# Patient Record
Sex: Female | Born: 1945 | Race: White | Hispanic: No | Marital: Married | Smoking: Never smoker
Health system: Southern US, Community
[De-identification: ages and names within clinical notes are randomized; demographics above are authoritative.]

## PROBLEM LIST (undated history)

## (undated) DIAGNOSIS — E039 Hypothyroidism, unspecified: Secondary | ICD-10-CM

## (undated) DIAGNOSIS — K219 Gastro-esophageal reflux disease without esophagitis: Secondary | ICD-10-CM

## (undated) DIAGNOSIS — M1990 Unspecified osteoarthritis, unspecified site: Secondary | ICD-10-CM

## (undated) DIAGNOSIS — R1013 Epigastric pain: Secondary | ICD-10-CM

## (undated) DIAGNOSIS — E663 Overweight: Secondary | ICD-10-CM

## (undated) DIAGNOSIS — I10 Essential (primary) hypertension: Secondary | ICD-10-CM

## (undated) DIAGNOSIS — T4145XA Adverse effect of unspecified anesthetic, initial encounter: Secondary | ICD-10-CM

## (undated) DIAGNOSIS — F419 Anxiety disorder, unspecified: Secondary | ICD-10-CM

## (undated) DIAGNOSIS — L719 Rosacea, unspecified: Secondary | ICD-10-CM

## (undated) DIAGNOSIS — R011 Cardiac murmur, unspecified: Secondary | ICD-10-CM

## (undated) DIAGNOSIS — Z9889 Other specified postprocedural states: Secondary | ICD-10-CM

## (undated) DIAGNOSIS — G709 Myoneural disorder, unspecified: Secondary | ICD-10-CM

## (undated) DIAGNOSIS — T8859XA Other complications of anesthesia, initial encounter: Secondary | ICD-10-CM

## (undated) DIAGNOSIS — E569 Vitamin deficiency, unspecified: Secondary | ICD-10-CM

## (undated) DIAGNOSIS — H9313 Tinnitus, bilateral: Secondary | ICD-10-CM

## (undated) DIAGNOSIS — R112 Nausea with vomiting, unspecified: Secondary | ICD-10-CM

## (undated) DIAGNOSIS — E7800 Pure hypercholesterolemia, unspecified: Secondary | ICD-10-CM

## (undated) DIAGNOSIS — M810 Age-related osteoporosis without current pathological fracture: Secondary | ICD-10-CM

## (undated) HISTORY — PX: OVARIAN CYST SURGERY: SHX726

## (undated) HISTORY — PX: NECK SURGERY: SHX720

## (undated) HISTORY — DX: Age-related osteoporosis without current pathological fracture: M81.0

## (undated) HISTORY — DX: Rosacea, unspecified: L71.9

## (undated) HISTORY — DX: Overweight: E66.3

## (undated) HISTORY — DX: Vitamin deficiency, unspecified: E56.9

## (undated) HISTORY — DX: Anxiety disorder, unspecified: F41.9

## (undated) HISTORY — PX: CHOLECYSTECTOMY: SHX55

## (undated) HISTORY — PX: APPENDECTOMY: SHX54

## (undated) HISTORY — DX: Hypothyroidism, unspecified: E03.9

## (undated) HISTORY — PX: TUBAL LIGATION: SHX77

## (undated) HISTORY — DX: Epigastric pain: R10.13

---

## 2001-12-24 ENCOUNTER — Encounter: Admission: RE | Admit: 2001-12-24 | Discharge: 2002-03-24 | Payer: Self-pay

## 2007-01-16 ENCOUNTER — Ambulatory Visit: Payer: Self-pay | Admitting: Cardiology

## 2007-01-16 LAB — CONVERTED CEMR LAB
Basophils Relative: 0.5 % (ref 0.0–1.0)
Monocytes Relative: 3.1 % (ref 3.0–11.0)
Neutro Abs: 7.2 10*3/uL (ref 1.4–7.7)
Platelets: 226 10*3/uL (ref 150–400)
RBC: 4.22 M/uL (ref 3.87–5.11)
RDW: 12.3 % (ref 11.5–14.6)

## 2007-01-23 ENCOUNTER — Ambulatory Visit: Payer: Self-pay

## 2007-02-06 ENCOUNTER — Ambulatory Visit: Payer: Self-pay | Admitting: Cardiology

## 2013-10-16 DIAGNOSIS — R9431 Abnormal electrocardiogram [ECG] [EKG]: Secondary | ICD-10-CM | POA: Diagnosis not present

## 2013-10-16 DIAGNOSIS — Z136 Encounter for screening for cardiovascular disorders: Secondary | ICD-10-CM | POA: Diagnosis not present

## 2013-10-16 DIAGNOSIS — Z13820 Encounter for screening for osteoporosis: Secondary | ICD-10-CM | POA: Diagnosis not present

## 2013-10-16 DIAGNOSIS — Z0000 Encounter for general adult medical examination without abnormal findings: Secondary | ICD-10-CM | POA: Diagnosis not present

## 2013-10-16 DIAGNOSIS — Z23 Encounter for immunization: Secondary | ICD-10-CM | POA: Diagnosis not present

## 2013-10-16 DIAGNOSIS — Z1239 Encounter for other screening for malignant neoplasm of breast: Secondary | ICD-10-CM | POA: Diagnosis not present

## 2013-10-16 DIAGNOSIS — I10 Essential (primary) hypertension: Secondary | ICD-10-CM | POA: Diagnosis not present

## 2013-10-16 DIAGNOSIS — E782 Mixed hyperlipidemia: Secondary | ICD-10-CM | POA: Diagnosis not present

## 2013-10-16 DIAGNOSIS — Z124 Encounter for screening for malignant neoplasm of cervix: Secondary | ICD-10-CM | POA: Diagnosis not present

## 2013-10-16 DIAGNOSIS — E7800 Pure hypercholesterolemia, unspecified: Secondary | ICD-10-CM | POA: Diagnosis not present

## 2013-10-22 DIAGNOSIS — Z809 Family history of malignant neoplasm, unspecified: Secondary | ICD-10-CM | POA: Diagnosis not present

## 2013-10-22 DIAGNOSIS — I059 Rheumatic mitral valve disease, unspecified: Secondary | ICD-10-CM | POA: Diagnosis not present

## 2013-10-22 DIAGNOSIS — I10 Essential (primary) hypertension: Secondary | ICD-10-CM | POA: Diagnosis not present

## 2013-10-22 DIAGNOSIS — M109 Gout, unspecified: Secondary | ICD-10-CM | POA: Diagnosis not present

## 2013-10-22 DIAGNOSIS — E785 Hyperlipidemia, unspecified: Secondary | ICD-10-CM | POA: Diagnosis not present

## 2013-10-22 DIAGNOSIS — Z8249 Family history of ischemic heart disease and other diseases of the circulatory system: Secondary | ICD-10-CM | POA: Diagnosis not present

## 2013-10-22 DIAGNOSIS — Z823 Family history of stroke: Secondary | ICD-10-CM | POA: Diagnosis not present

## 2013-10-22 DIAGNOSIS — Z789 Other specified health status: Secondary | ICD-10-CM | POA: Diagnosis not present

## 2013-10-28 DIAGNOSIS — Z13820 Encounter for screening for osteoporosis: Secondary | ICD-10-CM | POA: Diagnosis not present

## 2013-10-28 DIAGNOSIS — M810 Age-related osteoporosis without current pathological fracture: Secondary | ICD-10-CM | POA: Diagnosis not present

## 2013-10-28 DIAGNOSIS — M47812 Spondylosis without myelopathy or radiculopathy, cervical region: Secondary | ICD-10-CM | POA: Diagnosis not present

## 2013-10-28 DIAGNOSIS — Z1231 Encounter for screening mammogram for malignant neoplasm of breast: Secondary | ICD-10-CM | POA: Diagnosis not present

## 2013-10-28 DIAGNOSIS — R209 Unspecified disturbances of skin sensation: Secondary | ICD-10-CM | POA: Diagnosis not present

## 2013-10-28 DIAGNOSIS — M4802 Spinal stenosis, cervical region: Secondary | ICD-10-CM | POA: Diagnosis not present

## 2013-11-05 DIAGNOSIS — M5030 Other cervical disc degeneration, unspecified cervical region: Secondary | ICD-10-CM | POA: Diagnosis not present

## 2013-11-05 DIAGNOSIS — M4802 Spinal stenosis, cervical region: Secondary | ICD-10-CM | POA: Diagnosis not present

## 2013-11-05 DIAGNOSIS — M542 Cervicalgia: Secondary | ICD-10-CM | POA: Diagnosis not present

## 2013-11-06 DIAGNOSIS — I059 Rheumatic mitral valve disease, unspecified: Secondary | ICD-10-CM | POA: Diagnosis not present

## 2013-11-20 DIAGNOSIS — I10 Essential (primary) hypertension: Secondary | ICD-10-CM | POA: Diagnosis not present

## 2013-11-20 DIAGNOSIS — M542 Cervicalgia: Secondary | ICD-10-CM | POA: Diagnosis not present

## 2013-11-20 DIAGNOSIS — M47812 Spondylosis without myelopathy or radiculopathy, cervical region: Secondary | ICD-10-CM | POA: Diagnosis not present

## 2013-11-20 DIAGNOSIS — Z6829 Body mass index (BMI) 29.0-29.9, adult: Secondary | ICD-10-CM | POA: Diagnosis not present

## 2013-11-27 DIAGNOSIS — I10 Essential (primary) hypertension: Secondary | ICD-10-CM | POA: Diagnosis not present

## 2013-11-27 DIAGNOSIS — E785 Hyperlipidemia, unspecified: Secondary | ICD-10-CM | POA: Diagnosis not present

## 2013-11-27 DIAGNOSIS — I059 Rheumatic mitral valve disease, unspecified: Secondary | ICD-10-CM | POA: Diagnosis not present

## 2014-01-22 DIAGNOSIS — M545 Low back pain, unspecified: Secondary | ICD-10-CM | POA: Diagnosis not present

## 2014-01-22 DIAGNOSIS — IMO0002 Reserved for concepts with insufficient information to code with codable children: Secondary | ICD-10-CM | POA: Diagnosis not present

## 2014-01-22 DIAGNOSIS — M25519 Pain in unspecified shoulder: Secondary | ICD-10-CM | POA: Diagnosis not present

## 2014-01-22 DIAGNOSIS — W010XXA Fall on same level from slipping, tripping and stumbling without subsequent striking against object, initial encounter: Secondary | ICD-10-CM | POA: Diagnosis not present

## 2014-01-22 DIAGNOSIS — M542 Cervicalgia: Secondary | ICD-10-CM | POA: Diagnosis not present

## 2014-01-22 DIAGNOSIS — S199XXA Unspecified injury of neck, initial encounter: Secondary | ICD-10-CM | POA: Diagnosis not present

## 2014-01-22 DIAGNOSIS — S0993XA Unspecified injury of face, initial encounter: Secondary | ICD-10-CM | POA: Diagnosis not present

## 2014-01-22 DIAGNOSIS — R42 Dizziness and giddiness: Secondary | ICD-10-CM | POA: Diagnosis not present

## 2014-01-22 DIAGNOSIS — S46909A Unspecified injury of unspecified muscle, fascia and tendon at shoulder and upper arm level, unspecified arm, initial encounter: Secondary | ICD-10-CM | POA: Diagnosis not present

## 2014-01-22 DIAGNOSIS — S139XXA Sprain of joints and ligaments of unspecified parts of neck, initial encounter: Secondary | ICD-10-CM | POA: Diagnosis not present

## 2014-01-22 DIAGNOSIS — S0990XA Unspecified injury of head, initial encounter: Secondary | ICD-10-CM | POA: Diagnosis not present

## 2014-01-22 DIAGNOSIS — R51 Headache: Secondary | ICD-10-CM | POA: Diagnosis not present

## 2014-01-22 DIAGNOSIS — T148XXA Other injury of unspecified body region, initial encounter: Secondary | ICD-10-CM | POA: Diagnosis not present

## 2014-01-22 DIAGNOSIS — S4980XA Other specified injuries of shoulder and upper arm, unspecified arm, initial encounter: Secondary | ICD-10-CM | POA: Diagnosis not present

## 2014-01-29 DIAGNOSIS — M542 Cervicalgia: Secondary | ICD-10-CM | POA: Diagnosis not present

## 2014-01-29 DIAGNOSIS — F329 Major depressive disorder, single episode, unspecified: Secondary | ICD-10-CM | POA: Diagnosis not present

## 2014-01-29 DIAGNOSIS — E7800 Pure hypercholesterolemia, unspecified: Secondary | ICD-10-CM | POA: Diagnosis not present

## 2014-01-29 DIAGNOSIS — M109 Gout, unspecified: Secondary | ICD-10-CM | POA: Diagnosis not present

## 2014-01-29 DIAGNOSIS — I10 Essential (primary) hypertension: Secondary | ICD-10-CM | POA: Diagnosis not present

## 2014-01-29 DIAGNOSIS — R209 Unspecified disturbances of skin sensation: Secondary | ICD-10-CM | POA: Diagnosis not present

## 2014-01-29 DIAGNOSIS — E782 Mixed hyperlipidemia: Secondary | ICD-10-CM | POA: Diagnosis not present

## 2014-02-02 DIAGNOSIS — E782 Mixed hyperlipidemia: Secondary | ICD-10-CM | POA: Diagnosis not present

## 2014-02-02 DIAGNOSIS — I10 Essential (primary) hypertension: Secondary | ICD-10-CM | POA: Diagnosis not present

## 2014-03-04 DIAGNOSIS — M47812 Spondylosis without myelopathy or radiculopathy, cervical region: Secondary | ICD-10-CM | POA: Diagnosis not present

## 2014-03-04 DIAGNOSIS — M4802 Spinal stenosis, cervical region: Secondary | ICD-10-CM | POA: Diagnosis not present

## 2014-03-04 DIAGNOSIS — Z6826 Body mass index (BMI) 26.0-26.9, adult: Secondary | ICD-10-CM | POA: Diagnosis not present

## 2014-03-04 DIAGNOSIS — M542 Cervicalgia: Secondary | ICD-10-CM | POA: Diagnosis not present

## 2014-03-09 ENCOUNTER — Other Ambulatory Visit: Payer: Self-pay | Admitting: Neurosurgery

## 2014-04-02 ENCOUNTER — Encounter (HOSPITAL_COMMUNITY): Payer: Self-pay

## 2014-04-05 NOTE — Pre-Procedure Instructions (Signed)
Debra GentryKaren L Mullen  04/05/2014   Your procedure is scheduled on:  Wednesday, July 8th   [facility] 9:45 AM.             (Arrival time is per your surgeon's request)   Call this number if you have problems the morning of surgery: [phone]   Remember:   Do not eat food or drink liquids after midnight Tuesday, July 7th.    Take these medicines the morning of surgery with A SIP OF WATER: Metoprolol, Allopurinol   Do not wear jewelry, make-up or nail polish.  Do not wear lotions, powders, or perfumes. You may NOT wear deodorant.  Do not shave underarms & legs 48 hours prior to surgery.    Do not bring valuables to the hospital.  [facility] is not responsible for any belongings or valuables.               Contacts, dentures or bridgework may not be worn into surgery.  Leave suitcase in the car. After surgery it may be brought to your room.  For patients admitted to the hospital, discharge time is determined by your treatment team.           Name and phone number of your driver:    Special Instructions: "Preparing for Surgery" instruction sheet.   Please read over the following fact sheets that you were given: Pain Booklet, MRSA Information and Surgical Site Infection Prevention

## 2014-04-06 ENCOUNTER — Ambulatory Visit (HOSPITAL_COMMUNITY)
Admission: RE | Admit: 2014-04-06 | Discharge: 2014-04-06 | Disposition: A | Discharge status: Home / Self Care | Payer: Medicare Other | Source: Ambulatory Visit | Attending: Neurosurgery | Admitting: Neurosurgery

## 2014-04-06 ENCOUNTER — Encounter (HOSPITAL_COMMUNITY)
Admission: RE | Admit: 2014-04-06 | Discharge: 2014-04-06 | Disposition: A | Discharge status: Home / Self Care | Payer: Medicare Other | Source: Ambulatory Visit | Attending: Neurosurgery | Admitting: Neurosurgery

## 2014-04-06 ENCOUNTER — Encounter (HOSPITAL_COMMUNITY): Payer: Self-pay

## 2014-04-06 DIAGNOSIS — I10 Essential (primary) hypertension: Secondary | ICD-10-CM | POA: Insufficient documentation

## 2014-04-06 DIAGNOSIS — Z01818 Encounter for other preprocedural examination: Secondary | ICD-10-CM | POA: Diagnosis not present

## 2014-04-06 DIAGNOSIS — I517 Cardiomegaly: Secondary | ICD-10-CM | POA: Diagnosis not present

## 2014-04-06 HISTORY — DX: Unspecified osteoarthritis, unspecified site: M19.90

## 2014-04-06 HISTORY — DX: Pure hypercholesterolemia, unspecified: E78.00

## 2014-04-06 HISTORY — DX: Cardiac murmur, unspecified: R01.1

## 2014-04-06 HISTORY — DX: Other specified postprocedural states: Z98.890

## 2014-04-06 HISTORY — DX: Gastro-esophageal reflux disease without esophagitis: K21.9

## 2014-04-06 HISTORY — DX: Tinnitus, bilateral: H93.13

## 2014-04-06 HISTORY — DX: Myoneural disorder, unspecified: G70.9

## 2014-04-06 HISTORY — DX: Other complications of anesthesia, initial encounter: T88.59XA

## 2014-04-06 HISTORY — DX: Other specified postprocedural states: R11.2

## 2014-04-06 HISTORY — DX: Essential (primary) hypertension: I10

## 2014-04-06 HISTORY — DX: Adverse effect of unspecified anesthetic, initial encounter: T41.45XA

## 2014-04-06 LAB — CBC
HCT: 41.7 % (ref 36.0–46.0)
Hemoglobin: 13.7 g/dL (ref 12.0–15.0)
MCH: 30.6 pg (ref 26.0–34.0)
MCHC: 32.9 g/dL (ref 30.0–36.0)
MCV: 93.3 fL (ref 78.0–100.0)
Platelets: 200 10*3/uL (ref 150–400)
RBC: 4.47 MIL/uL (ref 3.87–5.11)
RDW: 12.5 % (ref 11.5–15.5)
WBC: 9.7 10*3/uL (ref 4.0–10.5)

## 2014-04-06 LAB — SURGICAL PCR SCREEN
MRSA, PCR: NEGATIVE
STAPHYLOCOCCUS AUREUS: NEGATIVE

## 2014-04-06 LAB — COMPREHENSIVE METABOLIC PANEL
ALT: 22 U/L (ref 0–35)
AST: 25 U/L (ref 0–37)
Albumin: 4.3 g/dL (ref 3.5–5.2)
Alkaline Phosphatase: 87 U/L (ref 39–117)
BILIRUBIN TOTAL: 0.3 mg/dL (ref 0.3–1.2)
BUN: 16 mg/dL (ref 6–23)
CHLORIDE: 101 meq/L (ref 96–112)
CO2: 27 mEq/L (ref 19–32)
Calcium: 10.4 mg/dL (ref 8.4–10.5)
Creatinine, Ser: 0.66 mg/dL (ref 0.50–1.10)
GFR calc Af Amer: >90 mL/min (ref >90)
GFR calc non Af Amer: >90 mL/min (ref >90)
Glucose, Bld: 99 mg/dL (ref 70–99)
POTASSIUM: 4.7 meq/L (ref 3.7–5.3)
Sodium: 141 mEq/L (ref 137–147)
TOTAL PROTEIN: 8 g/dL (ref 6.0–8.3)

## 2014-04-13 MED ORDER — CEFAZOLIN SODIUM-DEXTROSE 2-3 GM-% IV SOLR
2.0000 g | INTRAVENOUS | Status: AC
  Administered 2014-04-14: 2 g via INTRAVENOUS
  Filled 2014-04-13: qty 50

## 2014-04-14 ENCOUNTER — Inpatient Hospital Stay (HOSPITAL_COMMUNITY): Payer: Medicare Other

## 2014-04-14 ENCOUNTER — Encounter (HOSPITAL_COMMUNITY)
Admission: RE | Disposition: A | Discharge status: Home / Self Care | Payer: Self-pay | Source: Ambulatory Visit | Attending: Neurosurgery

## 2014-04-14 ENCOUNTER — Inpatient Hospital Stay (HOSPITAL_COMMUNITY)
Admission: RE | Admit: 2014-04-14 | Discharge: 2014-04-15 | DRG: 473 | Disposition: A | Discharge status: Home / Self Care | Payer: Medicare Other | Source: Ambulatory Visit | Attending: Neurosurgery | Admitting: Neurosurgery

## 2014-04-14 ENCOUNTER — Encounter (HOSPITAL_COMMUNITY): Payer: Self-pay | Admitting: *Deleted

## 2014-04-14 ENCOUNTER — Encounter (HOSPITAL_COMMUNITY): Payer: Medicare Other | Admitting: Vascular Surgery

## 2014-04-14 ENCOUNTER — Inpatient Hospital Stay (HOSPITAL_COMMUNITY): Payer: Medicare Other | Admitting: Certified Registered Nurse Anesthetist

## 2014-04-14 DIAGNOSIS — M4712 Other spondylosis with myelopathy, cervical region: Secondary | ICD-10-CM | POA: Diagnosis present

## 2014-04-14 DIAGNOSIS — M5030 Other cervical disc degeneration, unspecified cervical region: Secondary | ICD-10-CM | POA: Diagnosis present

## 2014-04-14 DIAGNOSIS — M79609 Pain in unspecified limb: Secondary | ICD-10-CM | POA: Diagnosis not present

## 2014-04-14 DIAGNOSIS — Z79899 Other long term (current) drug therapy: Secondary | ICD-10-CM

## 2014-04-14 DIAGNOSIS — I10 Essential (primary) hypertension: Secondary | ICD-10-CM | POA: Diagnosis not present

## 2014-04-14 DIAGNOSIS — E7800 Pure hypercholesterolemia, unspecified: Secondary | ICD-10-CM | POA: Diagnosis not present

## 2014-04-14 DIAGNOSIS — K219 Gastro-esophageal reflux disease without esophagitis: Secondary | ICD-10-CM | POA: Diagnosis present

## 2014-04-14 DIAGNOSIS — M4802 Spinal stenosis, cervical region: Secondary | ICD-10-CM | POA: Diagnosis not present

## 2014-04-14 DIAGNOSIS — M47812 Spondylosis without myelopathy or radiculopathy, cervical region: Principal | ICD-10-CM | POA: Diagnosis present

## 2014-04-14 DIAGNOSIS — M5382 Other specified dorsopathies, cervical region: Secondary | ICD-10-CM | POA: Diagnosis not present

## 2014-04-14 DIAGNOSIS — M5412 Radiculopathy, cervical region: Secondary | ICD-10-CM | POA: Diagnosis not present

## 2014-04-14 DIAGNOSIS — M4722 Other spondylosis with radiculopathy, cervical region: Secondary | ICD-10-CM

## 2014-04-14 DIAGNOSIS — Z981 Arthrodesis status: Secondary | ICD-10-CM | POA: Diagnosis not present

## 2014-04-14 HISTORY — DX: Other spondylosis with myelopathy, cervical region: M47.12

## 2014-04-14 HISTORY — PX: ANTERIOR CERVICAL DECOMP/DISCECTOMY FUSION: SHX1161

## 2014-04-14 SURGERY — ANTERIOR CERVICAL DECOMPRESSION/DISCECTOMY FUSION 3 LEVELS
Anesthesia: General

## 2014-04-14 MED ORDER — ONDANSETRON HCL 4 MG/2ML IJ SOLN
INTRAMUSCULAR | Status: AC
  Filled 2014-04-14: qty 2

## 2014-04-14 MED ORDER — BUPIVACAINE-EPINEPHRINE (PF) 0.5% -1:200000 IJ SOLN
INTRAMUSCULAR | Status: DC | PRN
  Administered 2014-04-14: 10 mL via PERINEURAL

## 2014-04-14 MED ORDER — BACITRACIN ZINC 500 UNIT/GM EX OINT
TOPICAL_OINTMENT | CUTANEOUS | Status: DC | PRN
  Administered 2014-04-14: 1 via TOPICAL

## 2014-04-14 MED ORDER — RAMIPRIL 5 MG PO CAPS
10.0000 mg | ORAL_CAPSULE | Freq: Every day | ORAL | Status: DC
  Administered 2014-04-14 – 2014-04-15 (×2): 10 mg via ORAL
  Filled 2014-04-14 (×2): qty 2

## 2014-04-14 MED ORDER — ROCURONIUM BROMIDE 50 MG/5ML IV SOLN
INTRAVENOUS | Status: AC
  Filled 2014-04-14: qty 1

## 2014-04-14 MED ORDER — DEXAMETHASONE SODIUM PHOSPHATE 4 MG/ML IJ SOLN
4.0000 mg | Freq: Four times a day (QID) | INTRAMUSCULAR | Status: AC
  Administered 2014-04-14 – 2014-04-15 (×3): 4 mg via INTRAVENOUS
  Filled 2014-04-14 (×3): qty 1

## 2014-04-14 MED ORDER — PHENYLEPHRINE 40 MCG/ML (10ML) SYRINGE FOR IV PUSH (FOR BLOOD PRESSURE SUPPORT)
PREFILLED_SYRINGE | INTRAVENOUS | Status: AC
  Filled 2014-04-14: qty 10

## 2014-04-14 MED ORDER — FENTANYL CITRATE 0.05 MG/ML IJ SOLN
INTRAMUSCULAR | Status: AC
  Filled 2014-04-14: qty 5

## 2014-04-14 MED ORDER — ARTIFICIAL TEARS OP OINT
TOPICAL_OINTMENT | OPHTHALMIC | Status: DC | PRN
  Administered 2014-04-14: 1 via OPHTHALMIC

## 2014-04-14 MED ORDER — METOCLOPRAMIDE HCL 5 MG/ML IJ SOLN
INTRAMUSCULAR | Status: AC
  Filled 2014-04-14: qty 2

## 2014-04-14 MED ORDER — MENTHOL 3 MG MT LOZG: 1.0000 | LOZENGE | OROMUCOSAL | Status: DC | PRN

## 2014-04-14 MED ORDER — ACETAMINOPHEN 650 MG RE SUPP: 650.0000 mg | RECTAL | Status: DC | PRN

## 2014-04-14 MED ORDER — FUROSEMIDE 20 MG PO TABS: 20.0000 mg | ORAL_TABLET | Freq: Every day | ORAL | Status: DC | PRN

## 2014-04-14 MED ORDER — HYDROCODONE-ACETAMINOPHEN 5-325 MG PO TABS: 1.0000 | ORAL_TABLET | ORAL | Status: DC | PRN

## 2014-04-14 MED ORDER — ALUM & MAG HYDROXIDE-SIMETH 200-200-20 MG/5ML PO SUSP: 30.0000 mL | Freq: Four times a day (QID) | ORAL | Status: DC | PRN

## 2014-04-14 MED ORDER — GLYCOPYRROLATE 0.2 MG/ML IJ SOLN
INTRAMUSCULAR | Status: AC
  Filled 2014-04-14: qty 3

## 2014-04-14 MED ORDER — METOCLOPRAMIDE HCL 5 MG/ML IJ SOLN
INTRAMUSCULAR | Status: DC | PRN
  Administered 2014-04-14: 10 mg via INTRAVENOUS

## 2014-04-14 MED ORDER — DEXAMETHASONE SODIUM PHOSPHATE 4 MG/ML IJ SOLN
INTRAMUSCULAR | Status: AC
  Filled 2014-04-14: qty 1

## 2014-04-14 MED ORDER — PROPOFOL 10 MG/ML IV BOLUS
INTRAVENOUS | Status: DC | PRN
  Administered 2014-04-14: 200 mg via INTRAVENOUS

## 2014-04-14 MED ORDER — MORPHINE SULFATE 2 MG/ML IJ SOLN
1.0000 mg | INTRAMUSCULAR | Status: DC | PRN
  Administered 2014-04-14 – 2014-04-15 (×3): 2 mg via INTRAVENOUS
  Filled 2014-04-14 (×3): qty 1

## 2014-04-14 MED ORDER — NEOSTIGMINE METHYLSULFATE 10 MG/10ML IV SOLN
INTRAVENOUS | Status: AC
Start: 2014-04-14 — End: 2014-04-14
  Filled 2014-04-14: qty 1

## 2014-04-14 MED ORDER — PHENOL 1.4 % MT LIQD: 1.0000 | OROMUCOSAL | Status: DC | PRN

## 2014-04-14 MED ORDER — ONDANSETRON HCL 4 MG/2ML IJ SOLN: 4.0000 mg | INTRAMUSCULAR | Status: DC | PRN

## 2014-04-14 MED ORDER — DOCUSATE SODIUM 100 MG PO CAPS
100.0000 mg | ORAL_CAPSULE | Freq: Two times a day (BID) | ORAL | Status: DC
  Administered 2014-04-14 – 2014-04-15 (×2): 100 mg via ORAL
  Filled 2014-04-14 (×2): qty 1

## 2014-04-14 MED ORDER — ARTIFICIAL TEARS OP OINT
TOPICAL_OINTMENT | OPHTHALMIC | Status: AC
  Filled 2014-04-14: qty 3.5

## 2014-04-14 MED ORDER — DIPHENOXYLATE-ATROPINE 2.5-0.025 MG PO TABS: 1.0000 | ORAL_TABLET | Freq: Four times a day (QID) | ORAL | Status: DC | PRN

## 2014-04-14 MED ORDER — CEFAZOLIN SODIUM-DEXTROSE 2-3 GM-% IV SOLR
2.0000 g | Freq: Three times a day (TID) | INTRAVENOUS | Status: AC
  Administered 2014-04-14 – 2014-04-15 (×2): 2 g via INTRAVENOUS
  Filled 2014-04-14 (×2): qty 50

## 2014-04-14 MED ORDER — FENTANYL CITRATE 0.05 MG/ML IJ SOLN
INTRAMUSCULAR | Status: DC | PRN
  Administered 2014-04-14 (×3): 50 ug via INTRAVENOUS
  Administered 2014-04-14: 150 ug via INTRAVENOUS
  Administered 2014-04-14: 50 ug via INTRAVENOUS

## 2014-04-14 MED ORDER — OXYCODONE-ACETAMINOPHEN 5-325 MG PO TABS
1.0000 | ORAL_TABLET | ORAL | Status: DC | PRN
  Administered 2014-04-14 – 2014-04-15 (×2): 2 via ORAL
  Administered 2014-04-15: 1 via ORAL
  Filled 2014-04-14: qty 2
  Filled 2014-04-14: qty 1

## 2014-04-14 MED ORDER — HYDROMORPHONE HCL PF 1 MG/ML IJ SOLN
INTRAMUSCULAR | Status: AC
  Filled 2014-04-14: qty 1

## 2014-04-14 MED ORDER — ONDANSETRON HCL 4 MG/2ML IJ SOLN: 4.0000 mg | Freq: Once | INTRAMUSCULAR | Status: DC | PRN

## 2014-04-14 MED ORDER — MIDAZOLAM HCL 5 MG/5ML IJ SOLN
INTRAMUSCULAR | Status: DC | PRN
  Administered 2014-04-14: 2 mg via INTRAVENOUS

## 2014-04-14 MED ORDER — OXYCODONE-ACETAMINOPHEN 5-325 MG PO TABS
ORAL_TABLET | ORAL | Status: AC
  Filled 2014-04-14: qty 2

## 2014-04-14 MED ORDER — HYDROMORPHONE HCL PF 1 MG/ML IJ SOLN
0.2500 mg | INTRAMUSCULAR | Status: DC | PRN
  Administered 2014-04-14 (×3): 0.5 mg via INTRAVENOUS

## 2014-04-14 MED ORDER — METOPROLOL SUCCINATE ER 25 MG PO TB24
50.0000 mg | ORAL_TABLET | Freq: Every day | ORAL | Status: DC
  Administered 2014-04-15: 50 mg via ORAL
  Filled 2014-04-14: qty 2

## 2014-04-14 MED ORDER — LACTATED RINGERS IV SOLN
INTRAVENOUS | Status: DC
  Administered 2014-04-14 (×2): via INTRAVENOUS

## 2014-04-14 MED ORDER — DEXAMETHASONE SODIUM PHOSPHATE 10 MG/ML IJ SOLN
INTRAMUSCULAR | Status: DC | PRN
  Administered 2014-04-14: 8 mg via INTRAVENOUS

## 2014-04-14 MED ORDER — PHENYLEPHRINE HCL 10 MG/ML IJ SOLN
INTRAMUSCULAR | Status: DC | PRN
  Administered 2014-04-14: 40 ug via INTRAVENOUS
  Administered 2014-04-14 (×2): 80 ug via INTRAVENOUS

## 2014-04-14 MED ORDER — LACTATED RINGERS IV SOLN
INTRAVENOUS | Status: DC
  Administered 2014-04-15: 02:00:00 via INTRAVENOUS

## 2014-04-14 MED ORDER — DIAZEPAM 5 MG PO TABS
5.0000 mg | ORAL_TABLET | Freq: Four times a day (QID) | ORAL | Status: DC | PRN
  Administered 2014-04-14: 5 mg via ORAL

## 2014-04-14 MED ORDER — NEOSTIGMINE METHYLSULFATE 10 MG/10ML IV SOLN
INTRAVENOUS | Status: DC | PRN
  Administered 2014-04-14: 3 mg via INTRAVENOUS

## 2014-04-14 MED ORDER — ACETAMINOPHEN 325 MG PO TABS: 650.0000 mg | ORAL_TABLET | ORAL | Status: DC | PRN

## 2014-04-14 MED ORDER — DEXAMETHASONE 4 MG PO TABS: 4.0000 mg | ORAL_TABLET | Freq: Four times a day (QID) | ORAL | Status: AC

## 2014-04-14 MED ORDER — ALLOPURINOL 100 MG PO TABS
100.0000 mg | ORAL_TABLET | Freq: Every day | ORAL | Status: DC
  Administered 2014-04-15: 100 mg via ORAL
  Filled 2014-04-14: qty 1

## 2014-04-14 MED ORDER — LACTATED RINGERS IV SOLN
INTRAVENOUS | Status: DC | PRN
  Administered 2014-04-14 (×2): via INTRAVENOUS

## 2014-04-14 MED ORDER — LIDOCAINE HCL 4 % MT SOLN
OROMUCOSAL | Status: DC | PRN
  Administered 2014-04-14: 4 mL via TOPICAL

## 2014-04-14 MED ORDER — ONDANSETRON HCL 4 MG/2ML IJ SOLN
INTRAMUSCULAR | Status: DC | PRN
  Administered 2014-04-14: 4 mg via INTRAVENOUS

## 2014-04-14 MED ORDER — GLYCOPYRROLATE 0.2 MG/ML IJ SOLN
INTRAMUSCULAR | Status: DC | PRN
  Administered 2014-04-14: 0.4 mg via INTRAVENOUS

## 2014-04-14 MED ORDER — LIDOCAINE HCL (CARDIAC) 20 MG/ML IV SOLN
INTRAVENOUS | Status: DC | PRN
  Administered 2014-04-14: 80 mg via INTRAVENOUS

## 2014-04-14 MED ORDER — THROMBIN 20000 UNITS EX SOLR
CUTANEOUS | Status: DC | PRN
  Administered 2014-04-14: 15:00:00 via TOPICAL

## 2014-04-14 MED ORDER — SODIUM CHLORIDE 0.9 % IR SOLN
Status: DC | PRN
  Administered 2014-04-14: 15:00:00

## 2014-04-14 MED ORDER — DIAZEPAM 5 MG PO TABS
ORAL_TABLET | ORAL | Status: AC
  Filled 2014-04-14: qty 2

## 2014-04-14 MED ORDER — LIDOCAINE HCL (CARDIAC) 20 MG/ML IV SOLN
INTRAVENOUS | Status: AC
  Filled 2014-04-14: qty 5

## 2014-04-14 MED ORDER — ROCURONIUM BROMIDE 100 MG/10ML IV SOLN
INTRAVENOUS | Status: DC | PRN
  Administered 2014-04-14: 10 mg via INTRAVENOUS
  Administered 2014-04-14: 30 mg via INTRAVENOUS

## 2014-04-14 MED ORDER — MIDAZOLAM HCL 2 MG/2ML IJ SOLN
INTRAMUSCULAR | Status: AC
  Filled 2014-04-14: qty 2

## 2014-04-14 MED ORDER — 0.9 % SODIUM CHLORIDE (POUR BTL) OPTIME
TOPICAL | Status: DC | PRN
  Administered 2014-04-14: 1000 mL

## 2014-04-14 MED ORDER — ALBUMIN HUMAN 5 % IV SOLN
INTRAVENOUS | Status: DC | PRN
  Administered 2014-04-14: 16:00:00 via INTRAVENOUS

## 2014-04-14 SURGICAL SUPPLY — 67 items
BAG DECANTER FOR FLEXI CONT (MISCELLANEOUS) ×3 IMPLANT
BENZOIN TINCTURE PRP APPL 2/3 (GAUZE/BANDAGES/DRESSINGS) ×3 IMPLANT
BIT DRILL NEURO 2X3.1 SFT TUCH (MISCELLANEOUS) ×1 IMPLANT
BLADE 10 SAFETY STRL DISP (BLADE) ×3 IMPLANT
BLADE SURG 15 STRL LF DISP TIS (BLADE) ×1 IMPLANT
BLADE SURG 15 STRL SS (BLADE) ×2
BLADE ULTRA TIP 2M (BLADE) ×3 IMPLANT
BRUSH SCRUB EZ PLAIN DRY (MISCELLANEOUS) ×3 IMPLANT
BUR BARREL STRAIGHT FLUTE 4.0 (BURR) ×3 IMPLANT
CANISTER SUCT 3000ML (MISCELLANEOUS) ×3 IMPLANT
CLOSURE WOUND 1/2 X4 (GAUZE/BANDAGES/DRESSINGS) ×1
CONT SPEC 4OZ CLIKSEAL STRL BL (MISCELLANEOUS) ×3 IMPLANT
COVER MAYO STAND STRL (DRAPES) ×3 IMPLANT
DEVICE FUSION VIST S 14X14X6MM (Trauma) ×3 IMPLANT
DRAIN JACKSON PRATT 10MM FLAT (MISCELLANEOUS) ×3 IMPLANT
DRAPE LAPAROTOMY 100X72 PEDS (DRAPES) ×3 IMPLANT
DRAPE MICROSCOPE LEICA (MISCELLANEOUS) IMPLANT
DRAPE POUCH INSTRU U-SHP 10X18 (DRAPES) ×3 IMPLANT
DRAPE SURG 17X23 STRL (DRAPES) ×6 IMPLANT
DRILL NEURO 2X3.1 SOFT TOUCH (MISCELLANEOUS) ×3
ELECT REM PT RETURN 9FT ADLT (ELECTROSURGICAL) ×3
ELECTRODE REM PT RTRN 9FT ADLT (ELECTROSURGICAL) ×1 IMPLANT
EVACUATOR SILICONE 100CC (DRAIN) ×3 IMPLANT
GAUZE SPONGE 4X4 16PLY XRAY LF (GAUZE/BANDAGES/DRESSINGS) IMPLANT
GLOVE BIO SURGEON STRL SZ8.5 (GLOVE) ×3 IMPLANT
GLOVE BIOGEL PI IND STRL 7.0 (GLOVE) ×3 IMPLANT
GLOVE BIOGEL PI IND STRL 7.5 (GLOVE) ×1 IMPLANT
GLOVE BIOGEL PI INDICATOR 7.0 (GLOVE) ×6
GLOVE BIOGEL PI INDICATOR 7.5 (GLOVE) ×2
GLOVE ECLIPSE 6.5 STRL STRAW (GLOVE) ×3 IMPLANT
GLOVE EXAM NITRILE LRG STRL (GLOVE) IMPLANT
GLOVE EXAM NITRILE MD LF STRL (GLOVE) IMPLANT
GLOVE EXAM NITRILE XL STR (GLOVE) IMPLANT
GLOVE EXAM NITRILE XS STR PU (GLOVE) IMPLANT
GLOVE SS BIOGEL STRL SZ 7.5 (GLOVE) ×4 IMPLANT
GLOVE SS BIOGEL STRL SZ 8 (GLOVE) ×3 IMPLANT
GLOVE SUPERSENSE BIOGEL SZ 7.5 (GLOVE) ×8
GLOVE SUPERSENSE BIOGEL SZ 8 (GLOVE) ×6
GOWN STRL REUS W/ TWL LRG LVL3 (GOWN DISPOSABLE) ×3 IMPLANT
GOWN STRL REUS W/ TWL XL LVL3 (GOWN DISPOSABLE) ×2 IMPLANT
GOWN STRL REUS W/TWL LRG LVL3 (GOWN DISPOSABLE) ×6
GOWN STRL REUS W/TWL XL LVL3 (GOWN DISPOSABLE) ×4
KIT BASIN OR (CUSTOM PROCEDURE TRAY) ×3 IMPLANT
KIT ROOM TURNOVER OR (KITS) ×3 IMPLANT
MARKER SKIN DUAL TIP RULER LAB (MISCELLANEOUS) ×3 IMPLANT
NEEDLE HYPO 22GX1.5 SAFETY (NEEDLE) ×3 IMPLANT
NEEDLE SPNL 18GX3.5 QUINCKE PK (NEEDLE) ×3 IMPLANT
NS IRRIG 1000ML POUR BTL (IV SOLUTION) ×3 IMPLANT
PACK LAMINECTOMY NEURO (CUSTOM PROCEDURE TRAY) ×3 IMPLANT
PATTIES SURGICAL .5 X.5 (GAUZE/BANDAGES/DRESSINGS) ×6 IMPLANT
PIN DISTRACTION 14MM (PIN) ×6 IMPLANT
PLATE ANT CERV XTEND 3 LV 39 (Plate) ×3 IMPLANT
PUTTY 5ML ACTIFUSE ABX (Putty) ×3 IMPLANT
RUBBERBAND STERILE (MISCELLANEOUS) IMPLANT
SCREW XTD VAR 4.2 SELF TAP 12 (Screw) ×24 IMPLANT
SPONGE GAUZE 4X4 12PLY (GAUZE/BANDAGES/DRESSINGS) ×3 IMPLANT
SPONGE INTESTINAL PEANUT (DISPOSABLE) ×6 IMPLANT
SPONGE SURGIFOAM ABS GEL 100 (HEMOSTASIS) ×3 IMPLANT
STRIP CLOSURE SKIN 1/2X4 (GAUZE/BANDAGES/DRESSINGS) ×2 IMPLANT
SUT VIC AB 0 CT1 27 (SUTURE) ×2
SUT VIC AB 0 CT1 27XBRD ANTBC (SUTURE) ×1 IMPLANT
SUT VIC AB 3-0 SH 8-18 (SUTURE) ×3 IMPLANT
SYR 20ML ECCENTRIC (SYRINGE) ×3 IMPLANT
TOWEL OR 17X24 6PK STRL BLUE (TOWEL DISPOSABLE) ×3 IMPLANT
TOWEL OR 17X26 10 PK STRL BLUE (TOWEL DISPOSABLE) ×3 IMPLANT
VISTA S O 14X14X6MM (Trauma) ×9 IMPLANT
WATER STERILE IRR 1000ML POUR (IV SOLUTION) ×3 IMPLANT

## 2014-04-14 NOTE — Anesthesia Postprocedure Evaluation (Signed)
  Anesthesia Post-op Note  Patient: Debra GentryKaren L Mullen  Procedure(s) Performed: Procedure(s): ANTERIOR CERVICAL DECOMPRESSION/DISCECTOMY FUSION 3 LEVELS cervical four/five, five/six, six/seven (N/A)  Patient Location: PACU  Anesthesia Type:General  Level of Consciousness: awake, alert , oriented and patient cooperative  Airway and Oxygen Therapy: Patient Spontanous Breathing  Post-op Pain: mild  Post-op Assessment: Post-op Vital signs reviewed, Patient's Cardiovascular Status Stable, Respiratory Function Stable, Patent Airway and No signs of Nausea or vomiting  Post-op Vital Signs: stable  Last Vitals:  Filed Vitals:   04/14/14 0937  BP: 167/54  Pulse: 63  Temp: 36.7 C  Resp: 20    Complications: No apparent anesthesia complications

## 2014-04-14 NOTE — Transfer of Care (Signed)
Immediate Anesthesia Transfer of Care Note  Patient: Debra GentryKaren L Korell  Procedure(s) Performed: Procedure(s): ANTERIOR CERVICAL DECOMPRESSION/DISCECTOMY FUSION 3 LEVELS cervical four/five, five/six, six/seven (N/A)  Patient Location: PACU  Anesthesia Type:General  Level of Consciousness: awake, alert , oriented and patient cooperative  Airway & Oxygen Therapy: Patient Spontanous Breathing and Patient connected to nasal cannula oxygen  Post-op Assessment: Report given to PACU RN, Post -op Vital signs reviewed and stable and Patient moving all extremities X 4  Post vital signs: Reviewed and stable  Complications: No apparent anesthesia complications

## 2014-04-14 NOTE — Anesthesia Procedure Notes (Signed)
Procedure Name: Intubation Date/Time: 04/14/2014 12:58 PM Performed by: Ned Grace Pre-anesthesia Checklist: Patient identified, Timeout performed, Emergency Drugs available, Suction available and Patient being monitored Patient Re-evaluated:Patient Re-evaluated prior to inductionOxygen Delivery Method: Circle system utilized Preoxygenation: Pre-oxygenation with 100% oxygen Intubation Type: IV induction Ventilation: Mask ventilation without difficulty Laryngoscope Size: Mac and 3 Grade View: Grade II Tube type: Oral Tube size: 7.0 mm Number of attempts: 1 Airway Equipment and Method: Stylet and LTA kit utilized Placement Confirmation: ETT inserted through vocal cords under direct vision,  breath sounds checked- equal and bilateral and positive ETCO2 Secured at: 22 cm Tube secured with: Tape Dental Injury: Teeth and Oropharynx as per pre-operative assessment

## 2014-04-14 NOTE — Progress Notes (Signed)
Subjective:  The patient is, but easily arousable. She is in no apparent distress. She looks well.  Objective: Vital signs in last 24 hours: Temp:  [98 F (36.7 C)] 98 F (36.7 C) (07/08 0937) Pulse Rate:  [63] 63 (07/08 0937) Resp:  [20] 20 (07/08 0937) BP: (167)/(54) 167/54 mmHg (07/08 0937) SpO2:  [100 %] 100 % (07/08 0937) Weight:  [74.645 kg (164 lb 9 oz)] 74.645 kg (164 lb 9 oz) (07/08 0937)  Intake/Output from previous day:   Intake/Output this shift: Total I/O In: 2250 [I.V.:2000; IV Piggyback:250] Out: 150 [Blood:150]  Physical exam the patient is Glasgow Coma Scale 14. She is moving all 4 extremities well. Her dressing is clean and dry. There is no evidence of hematoma or shift.  Lab Results: No results found for this basename: WBC, HGB, HCT, PLT,  in the last 72 hours BMET No results found for this basename: NA, K, CL, CO2, GLUCOSE, BUN, CREATININE, CALCIUM,  in the last 72 hours  Studies/Results: Dg Cervical Spine 2-3 Views  04/14/2014   CLINICAL DATA:  Lateral portable cervical spine images for surgical localization for cervical fusion from C4 through C7.  EXAM: CERVICAL SPINE - 2-3 VIEW  COMPARISON:  MRI, 11/05/2013  FINDINGS: Initial image shows placement of a surgical probe with its tip superimposed over the anterior aspect of the C4-C5 disc. Subsequent image shows placement of a fusion plate with fixation screws spanning C4 through C7. Radiolucent disc spacers and bone graft material are well positioned maintaining disc height at diffuse levels. Orthopedic hardware is well-seated. No fracture or evidence of an operative complication.  IMPRESSION: Lateral cervical spine imaging during anterior cervical disc fusion from C4 through C7.   Electronically Signed   By: Amie Portlandavid  Ormond M.D.   On: 04/14/2014 16:21    Assessment/Plan: The patient is doing well. I spoke with her husband.  LOS: 0 days     Vipul Cafarelli D 04/14/2014, 4:49 PM

## 2014-04-14 NOTE — Op Note (Signed)
Brief history: The patient is a 68 year old white female who has complained of neck and arm pain consistent with a cervical radiculopathy. She has failed medical management and was worked up with a cervical MRI. This demonstrated this degeneration, spondylosis, stenosis, etc. at C4-5, C5-6 and C6-7. I discussed situation with the patient. She has weighed the risks, benefits, and alternatives surgery and decided proceed with a C4-5, C5-6 and C6-7 anterior cervical discectomy, fusion, and plating.  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 Delma OfficerJeff Gino Garrabrant  Asst.: Dr. Barbaraann BarthelKyle Cabell  Anesthesia: Gen. endotracheal  Estimated blood loss: 125 cc  Drains: One 10 mm flat Jackson-Pratt drain in the prevertebral space.  Complications: None  Description of procedure: The patient was brought to the operating room by the anesthesia team. General endotracheal anesthesia was induced. A roll was placed under the patient's shoulders to keep the neck in the neutral position. The patient's anterior cervical region was then prepared with Betadine scrub and Betadine solution. Sterile drapes were applied.  The area to be incised was then injected with Marcaine with epinephrine solution. I then used a scalpel to make a transverse incision in the patient's left anterior neck. I used the Metzenbaum scissors to divide the platysmal muscle and then to dissect medial to the sternocleidomastoid muscle, jugular vein, and carotid artery. I carefully dissected down  towards the anterior cervical spine identifying the esophagus and retracting it medially. Then using Kitner swabs to clear soft tissue from the anterior cervical spine. We then inserted a bent spinal needle into the upper exposed intervertebral disc space. We then obtained intraoperative radiographs confirm our location.  I then used electrocautery to detach the medial border of the longus colli muscle bilaterally from the C4-5, C5-6 and C6-7 intervertebral disc spaces. I then inserted the Caspar self-retaining retractor underneath the longus colli muscle bilaterally to provide exposure.  We then incised the intervertebral disc at C4-5. We then performed a partial intervertebral discectomy with a pituitary forceps and the Karlin curettes. I then inserted distraction screws into the vertebral bodies at C4-5. We then distracted the interspace. We then used the high-speed drill to decorticate the vertebral endplates at C4-5, to drill away the remainder of the intervertebral disc, to drill away some posterior spondylosis, and to thin out the posterior longitudinal ligament. I then incised ligament with the arachnoid knife. We then removed the ligament with a Kerrison punches undercutting the vertebral endplates and decompressing the thecal sac. We then performed foraminotomies about the bilateral C5 nerve roots. This completed the decompression at this level.  We then repeated procedure in an analogous fashion at C5-6 and C6-7 decompressing the thecal sac and the bilateral C7 nerve roots.  We now turned our to attention to the interbody fusion. We used the trial spacers to determine the appropriate size for the interbody prosthesis. We then pre-filled prosthesis with a combination of local morcellized autograft bone that we obtained during decompression as well as Actifuse bone graft extender. We then inserted the prosthesis into the distracted interspace at C4-5, C5-6 and C6-7. We then removed the distraction  screws. There was a good snug fit of the  prosthesis in the interspace.  Having completed the fusion we now turned attention to the anterior spinal instrumentation. We used the high-speed drill to drill away some anterior spondylosis at the disc spaces so that the plate lay down flat. We selected the appropriate length titanium anterior cervical plate. We laid it along the anterior aspect of the vertebral bodies from C4-C7. We then drilled 12 mm holes at C4, C5, C6 and C7. We then secured the plate to the vertebral bodies by placing two 12 mm self-tapping screws at C4, C5, C6 and C7. We then obtained intraoperative radiograph. The demonstrating good position of the instrumentation. We therefore secured the screws the plate the locking each cam. This completed the instrumentation.  We then obtained hemostasis using bipolar electrocautery. We irrigated the wound out with bacitracin solution. We then removed the retractor. We inspected the esophagus for any damage. There was none apparent. I placed a 10 mm flat Jackson-Pratt drain in the prevertebral space. We tunneled it out through separate stab wound. We then reapproximated patient's platysmal muscle with interrupted 3-0 Vicryl suture. We then reapproximated the subcutaneous tissue with interrupted 3-0 Vicryl suture. The skin was reapproximated with Steri-Strips and benzoin. The wound was then covered with bacitracin ointment. A sterile dressing was applied. The drapes were removed. Patient was subsequently extubated by the anesthesia team and transported to the post anesthesia care unit in stable condition. All sponge instrument and needle counts were reportedly correct at the end of this case.

## 2014-04-14 NOTE — H&P (Signed)
Subjective: Patient is a 68 year old white female who has complained of neck, shoulder, and arm pain consistent with a cervical radiculopathy. She has failed medical management and was worked up with a cervical MRI. This demonstrated this degeneration spondylosis stenosis etc. most prominent at C4-5, C5-6 and C6-7. We discussed the various treatment options including surgery. Patient has decided proceed with a anterior cervical,  discectomy, fusion, and plating.   Past Medical History  Diagnosis Date  . Complication of anesthesia   . PONV (postoperative nausea and vomiting)   . Hypertension   . GERD (gastroesophageal reflux disease)   . Neuromuscular disorder     family hx of Muscular Dystrophy  . Elevated cholesterol   . Murmur, heart   . Arthritis   . Tinnitus of both ears     hears out of right ear better    Past Surgical History  Procedure Laterality Date  . Ovarian cyst surgery      "yrs' ago--age 66  . Tubal ligation    . Cholecystectomy    . Appendectomy      No Known Allergies  History  Substance Use Topics  . Smoking status: Never Smoker   . Smokeless tobacco: Not on file  . Alcohol Use: No    History reviewed. No pertinent family history. Prior to Admission medications   Medication Sig Start Date End Date Taking? Authorizing Provider  allopurinol (ZYLOPRIM) 100 MG tablet Take 100 mg by mouth daily.   Yes Historical Provider, MD  Calcium Carb-Cholecalciferol (CALCIUM 600 + D PO) Take 1 tablet by mouth daily.   Yes Historical Provider, MD  Coenzyme Q10 (CO Q 10) 100 MG CAPS Take 1 capsule by mouth 2 (two) times a week. Monday and Thursday   Yes Historical Provider, MD  diphenoxylate-atropine (LOMOTIL) 2.5-0.025 MG per tablet Take 1 tablet by mouth 4 (four) times daily as needed for diarrhea or loose stools.   Yes Historical Provider, MD  furosemide (LASIX) 20 MG tablet Take 20 mg by mouth daily as needed for fluid.   Yes Historical Provider, MD  metoprolol succinate  (TOPROL-XL) 50 MG 24 hr tablet Take 50 mg by mouth daily. Take with or immediately following a meal.   Yes Historical Provider, MD  Omega-3 Fatty Acids (FISH OIL) 1000 MG CAPS Take 1,000 mg by mouth 4 (four) times daily.    Yes Historical Provider, MD  ramipril (ALTACE) 10 MG capsule Take 10 mg by mouth daily.   Yes Historical Provider, MD     Review of Systems  Positive ROS: As above  All other systems have been reviewed and were otherwise negative with the exception of those mentioned in the HPI and as above.  Objective: Vital signs in last 24 hours: Temp:  [98 F (36.7 C)] 98 F (36.7 C) (07/08 0937) Pulse Rate:  [63] 63 (07/08 0937) Resp:  [20] 20 (07/08 0937) BP: (167)/(54) 167/54 mmHg (07/08 0937) SpO2:  [100 %] 100 % (07/08 0937) Weight:  [74.645 kg (164 lb 9 oz)] 74.645 kg (164 lb 9 oz) (07/08 0937)  General Appearance: Alert, cooperative, no distress, Head: Normocephalic, without obvious abnormality, atraumatic Eyes: PERRL, conjunctiva/corneas clear, EOM's intact,    Ears: Normal  Throat: Normal  Neck: Supple, symmetrical, trachea midline, no adenopathy; thyroid: No enlargement/tenderness/nodules; no carotid bruit or JVD Back: Symmetric, no curvature, ROM normal, no CVA tenderness Lungs: Clear to auscultation bilaterally, respirations unlabored Heart: Regular rate and rhythm, no murmur, rub or gallop Abdomen: Soft, non-tender,, no masses,  no organomegaly Extremities: Extremities normal, atraumatic, no cyanosis or edema Pulses: 2+ and symmetric all extremities Skin: Skin color, texture, turgor normal, no rashes or lesions  NEUROLOGIC:   Mental status: alert and oriented, no aphasia, good attention span, Fund of knowledge/ memory ok Motor Exam - grossly normal Sensory Exam - grossly normal Reflexes:  Coordination - grossly normal Gait - grossly normal Balance - grossly normal Cranial Nerves: I: smell Not tested  II: visual acuity  OS: Normal  OD: Normal   II:  visual fields Full to confrontation  II: pupils Equal, round, reactive to light  III,VII: ptosis None  III,IV,VI: extraocular muscles  Full ROM  V: mastication Normal  V: facial light touch sensation  Normal  V,VII: corneal reflex  Present  VII: facial muscle function - upper  Normal  VII: facial muscle function - lower Normal  VIII: hearing Not tested  IX: soft palate elevation  Normal  IX,X: gag reflex Present  XI: trapezius strength  5/5  XI: sternocleidomastoid strength 5/5  XI: neck flexion strength  5/5  XII: tongue strength  Normal    Data Review Lab Results  Component Value Date   WBC 9.7 04/06/2014   HGB 13.7 04/06/2014   HCT 41.7 04/06/2014   MCV 93.3 04/06/2014   PLT 200 04/06/2014   Lab Results  Component Value Date   NA 141 04/06/2014   K 4.7 04/06/2014   CL 101 04/06/2014   CO2 27 04/06/2014   BUN 16 04/06/2014   CREATININE 0.66 04/06/2014   GLUCOSE 99 04/06/2014   No results found for this basename: INR, PROTIME    Assessment/Plan: C4-5, 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 I reviewed her imaging studies with her and pointed out the abnormalities. We have discussed the various treatment options including surgery. I described the surgical treatment option of a C4-5, C5-6 and C6-7 anterior cervical discectomy, fusion, and plating. I have described the surgery to her. I've shown her surgical models. We have discussed the risks, benefits, alternatives, and likelihood of achieving our goals with surgery. I've answered all the patient's questions. She has decided to proceed with surgery.   Arali Somera D 04/14/2014 10:18 AM

## 2014-04-14 NOTE — Anesthesia Preprocedure Evaluation (Signed)
Anesthesia Evaluation  Patient identified by MRN, date of birth, ID band Patient awake    History of Anesthesia Complications (+) PONV  Airway       Dental   Pulmonary          Cardiovascular hypertension,     Neuro/Psych  Neuromuscular disease    GI/Hepatic GERD-  ,  Endo/Other    Renal/GU      Musculoskeletal   Abdominal   Peds  Hematology   Anesthesia Other Findings   Reproductive/Obstetrics                           Anesthesia Physical Anesthesia Plan  ASA: II  Anesthesia Plan: General   Post-op Pain Management:    Induction: Intravenous  Airway Management Planned: Oral ETT  Additional Equipment:   Intra-op Plan:   Post-operative Plan: Extubation in OR  Informed Consent: I have reviewed the patients History and Physical, chart, labs and discussed the procedure including the risks, benefits and alternatives for the proposed anesthesia with the patient or authorized representative who has indicated his/her understanding and acceptance.     Plan Discussed with:   Anesthesia Plan Comments:         Anesthesia Quick Evaluation

## 2014-04-15 DIAGNOSIS — M4802 Spinal stenosis, cervical region: Secondary | ICD-10-CM

## 2014-04-15 HISTORY — DX: Spinal stenosis, cervical region: M48.02

## 2014-04-15 MED ORDER — OXYCODONE-ACETAMINOPHEN 5-325 MG PO TABS: 1.0000 | ORAL_TABLET | ORAL | Status: DC | PRN

## 2014-04-15 MED ORDER — DIAZEPAM 5 MG PO TABS: 5.0000 mg | ORAL_TABLET | Freq: Four times a day (QID) | ORAL | Status: DC | PRN

## 2014-04-15 MED ORDER — GABAPENTIN 300 MG PO CAPS: 300.0000 mg | ORAL_CAPSULE | Freq: Three times a day (TID) | ORAL | Status: DC

## 2014-04-15 MED ORDER — DSS 100 MG PO CAPS: 100.0000 mg | ORAL_CAPSULE | Freq: Two times a day (BID) | ORAL | Status: DC

## 2014-04-15 NOTE — Progress Notes (Signed)
Discharge orders received.  Discharge orders and follow-up instructions reviewed with the patient.  Education complete. Vital signs stable upon discharge, transported out via wheelchair, husband present.   Sondra ComeSilva, Daysie Helf M, RN 04/15/2014

## 2014-04-15 NOTE — Progress Notes (Signed)
Utilization review completed.  

## 2014-04-15 NOTE — Progress Notes (Signed)
Orthopedic Tech Progress Note Patient Details:  Dene GentryKaren L Slagel 09/16/1946 [id number] Spoke with nursing, patient already has aspen collar. No action needed form Ortho tech at this time. Patient ID: Dene GentryKaren L Mick, female   DOB: 05/28/1946, 68 y.o.   MRN: [id number]   Orie Routsia R Thompson 04/15/2014, 10:22 AM

## 2014-04-16 ENCOUNTER — Encounter (HOSPITAL_COMMUNITY): Payer: Self-pay | Admitting: Neurosurgery

## 2014-04-16 NOTE — Care Management Note (Signed)
    Page 1 of 1   04/16/2014     10:34:41 AM CARE MANAGEMENT NOTE 04/16/2014  Patient:  Debra Mullen,Debra Mullen   Account Number:  [id number]  Date Initiated:  04/16/2014  Documentation initiated by:  Elmer BalesOBARGE,COURTNEY  Subjective/Objective Assessment:   Patient was admitted for ACDF. Lives at home with spouse.     Action/Plan:   Will follow for discharge needs pending physician orders.   Anticipated DC Date:     Anticipated DC Plan:  HOME/SELF CARE         Choice offered to / List presented to:             Status of service:  Completed, signed off Medicare Important Message given?  NA - LOS <3 / Initial given by admissions (If response is "NO", the following Medicare IM given date fields will be blank) Date Medicare IM given:   Medicare IM given by:   Date Additional Medicare IM given:   Additional Medicare IM given by:    Discharge Disposition:    Per UR Regulation:    If discussed at Long Length of Stay Meetings, dates discussed:    Comments:

## 2014-05-05 NOTE — Discharge Summary (Signed)
Physician Discharge Summary  Patient ID: Debra Mullen MRN: [id number] DOB/AGE: [birth year] 66 y.o.  Admit date: 04/14/2014 Discharge date: 05/05/2014  Admission Diagnoses: C4-5, C5-6 and C6-7 disc degeneration, spondylosis, stenosis, cervicalgia, cervical radiculopathy, cervical myelopathy  Discharge Diagnoses: The same Active Problems:   Cervical spondylosis with myelopathy and radiculopathy   Spinal stenosis in cervical region   Discharged Condition: good  Hospital Course: I performed a C4-5, C5-6 and C6-7 anterior cervical discectomy, fusion, and plating on the patient on 04/14/2014. The surgery went well.  The patient's postoperative course was unremarkable. The patient requested discharge to home on 04/15/2014. She was given oral and written discharge instructions. All her questions were answered.  Consults: None Significant Diagnostic Studies: None Treatments: C4-5, C5-6 and C6-7 anterior cervical discectomy, fusion, and plating Discharge Exam: Blood pressure 142/51, pulse 71, temperature 98.4 F (36.9 C), temperature source Oral, resp. rate 18, height 5\' 6"  (1.676 m), weight 74.645 kg (164 lb 9 oz), SpO2 98.00%. The patient is alert and pleasant. Her wound was healing well. There is no signs of hematoma. Her strength is normal.  Disposition: Home  Discharge Instructions   Call MD for:  difficulty breathing, headache or visual disturbances    Complete by:  As directed      Call MD for:  extreme fatigue    Complete by:  As directed      Call MD for:  hives    Complete by:  As directed      Call MD for:  persistant dizziness or light-headedness    Complete by:  As directed      Call MD for:  persistant nausea and vomiting    Complete by:  As directed      Call MD for:  redness, tenderness, or signs of infection (pain, swelling, redness, odor or green/yellow discharge around incision site)    Complete by:  As directed      Call MD for:  severe uncontrolled pain     Complete by:  As directed      Call MD for:  temperature >100.4    Complete by:  As directed      Diet - low sodium heart healthy    Complete by:  As directed      Discharge instructions    Complete by:  As directed   Call [phone] for a followup appointment. Take a stool softener while you are using pain medications.     Driving Restrictions    Complete by:  As directed   Do not drive for 2 weeks.     Increase activity slowly    Complete by:  As directed      Lifting restrictions    Complete by:  As directed   Do not lift more than 5 pounds. No excessive bending or twisting.     May shower / Bathe    Complete by:  As directed   He may shower after the pain she is removed 3 days after surgery. Leave the incision alone.     Remove dressing in 48 hours    Complete by:  As directed   Your stitches are under the scan and will dissolve by themselves. The Steri-Strips will fall off after you take a few showers. Do not rub back or pick at the wound, Leave the wound alone.            Medication List         allopurinol 100 MG tablet  Commonly known as:  ZYLOPRIM  Take 100 mg by mouth daily.     CALCIUM 600 + D PO  Take 1 tablet by mouth daily.     Co Q 10 100 MG Caps  Take 1 capsule by mouth 2 (two) times a week. Monday and Thursday     diazepam 5 MG tablet  Commonly known as:  VALIUM  Take 1 tablet (5 mg total) by mouth every 6 (six) hours as needed for muscle spasms.     diphenoxylate-atropine 2.5-0.025 MG per tablet  Commonly known as:  LOMOTIL  Take 1 tablet by mouth 4 (four) times daily as needed for diarrhea or loose stools.     DSS 100 MG Caps  Take 100 mg by mouth 2 (two) times daily.     Fish Oil 1000 MG Caps  Take 1,000 mg by mouth 4 (four) times daily.     furosemide 20 MG tablet  Commonly known as:  LASIX  Take 20 mg by mouth daily as needed for fluid.     gabapentin 300 MG capsule  Commonly known as:  NEURONTIN  Take 1 capsule (300 mg total) by  mouth 3 (three) times daily.     metoprolol succinate 50 MG 24 hr tablet  Commonly known as:  TOPROL-XL  Take 50 mg by mouth daily. Take with or immediately following a meal.     oxyCODONE-acetaminophen 5-325 MG per tablet  Commonly known as:  PERCOCET/ROXICET  Take 1-2 tablets by mouth every 4 (four) hours as needed for moderate pain.     ramipril 10 MG capsule  Commonly known as:  ALTACE  Take 10 mg by mouth daily.         SignedCristi Loron: Zayquan Bogard D 05/05/2014, 1:27 PM

## 2014-05-10 DIAGNOSIS — E782 Mixed hyperlipidemia: Secondary | ICD-10-CM | POA: Diagnosis not present

## 2014-05-10 DIAGNOSIS — E538 Deficiency of other specified B group vitamins: Secondary | ICD-10-CM | POA: Diagnosis not present

## 2014-05-10 DIAGNOSIS — R413 Other amnesia: Secondary | ICD-10-CM | POA: Diagnosis not present

## 2014-05-10 DIAGNOSIS — I10 Essential (primary) hypertension: Secondary | ICD-10-CM | POA: Diagnosis not present

## 2014-05-10 DIAGNOSIS — E7800 Pure hypercholesterolemia, unspecified: Secondary | ICD-10-CM | POA: Diagnosis not present

## 2014-05-11 DIAGNOSIS — M542 Cervicalgia: Secondary | ICD-10-CM | POA: Diagnosis not present

## 2014-06-03 DIAGNOSIS — M79609 Pain in unspecified limb: Secondary | ICD-10-CM | POA: Diagnosis not present

## 2014-08-10 DIAGNOSIS — M542 Cervicalgia: Secondary | ICD-10-CM | POA: Diagnosis not present

## 2014-08-10 DIAGNOSIS — Z09 Encounter for follow-up examination after completed treatment for conditions other than malignant neoplasm: Secondary | ICD-10-CM | POA: Diagnosis not present

## 2014-08-10 DIAGNOSIS — I10 Essential (primary) hypertension: Secondary | ICD-10-CM | POA: Diagnosis not present

## 2014-08-10 DIAGNOSIS — Z6825 Body mass index (BMI) 25.0-25.9, adult: Secondary | ICD-10-CM | POA: Diagnosis not present

## 2014-08-24 DIAGNOSIS — Z1389 Encounter for screening for other disorder: Secondary | ICD-10-CM | POA: Diagnosis not present

## 2014-08-24 DIAGNOSIS — M109 Gout, unspecified: Secondary | ICD-10-CM | POA: Diagnosis not present

## 2014-08-24 DIAGNOSIS — R42 Dizziness and giddiness: Secondary | ICD-10-CM | POA: Diagnosis not present

## 2014-08-24 DIAGNOSIS — I10 Essential (primary) hypertension: Secondary | ICD-10-CM | POA: Diagnosis not present

## 2014-08-24 DIAGNOSIS — E782 Mixed hyperlipidemia: Secondary | ICD-10-CM | POA: Diagnosis not present

## 2014-09-01 IMAGING — CR DG CHEST 2V
2 series · 2 of 2 positions shown · non-contrast
Comparison: 06/24/2005

CLINICAL DATA: Preop. Dry cough for 3 weeks. Patient reports
allergies and history of hypertension.

EXAM:
CHEST  2 VIEW

[w chest pa]
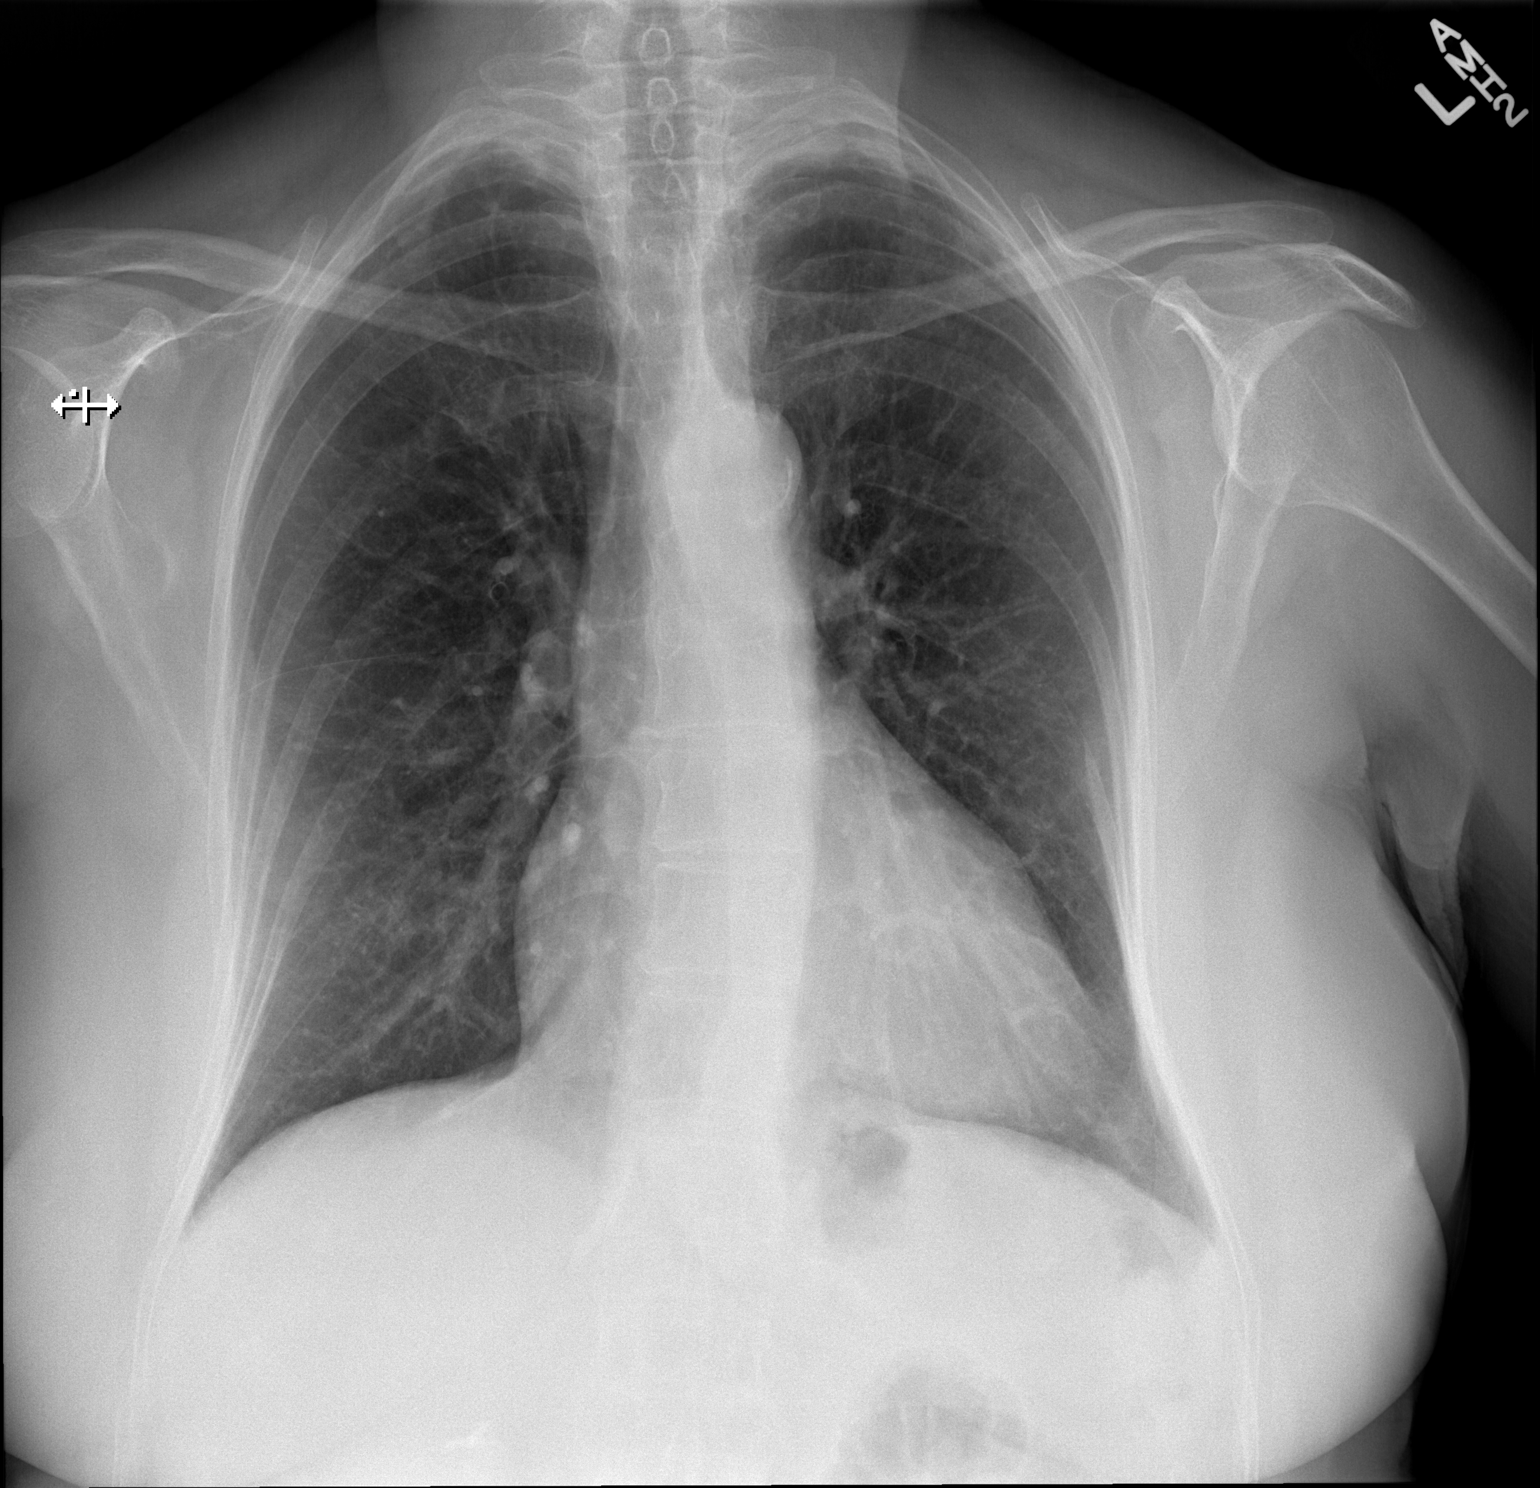

[w chest lat]
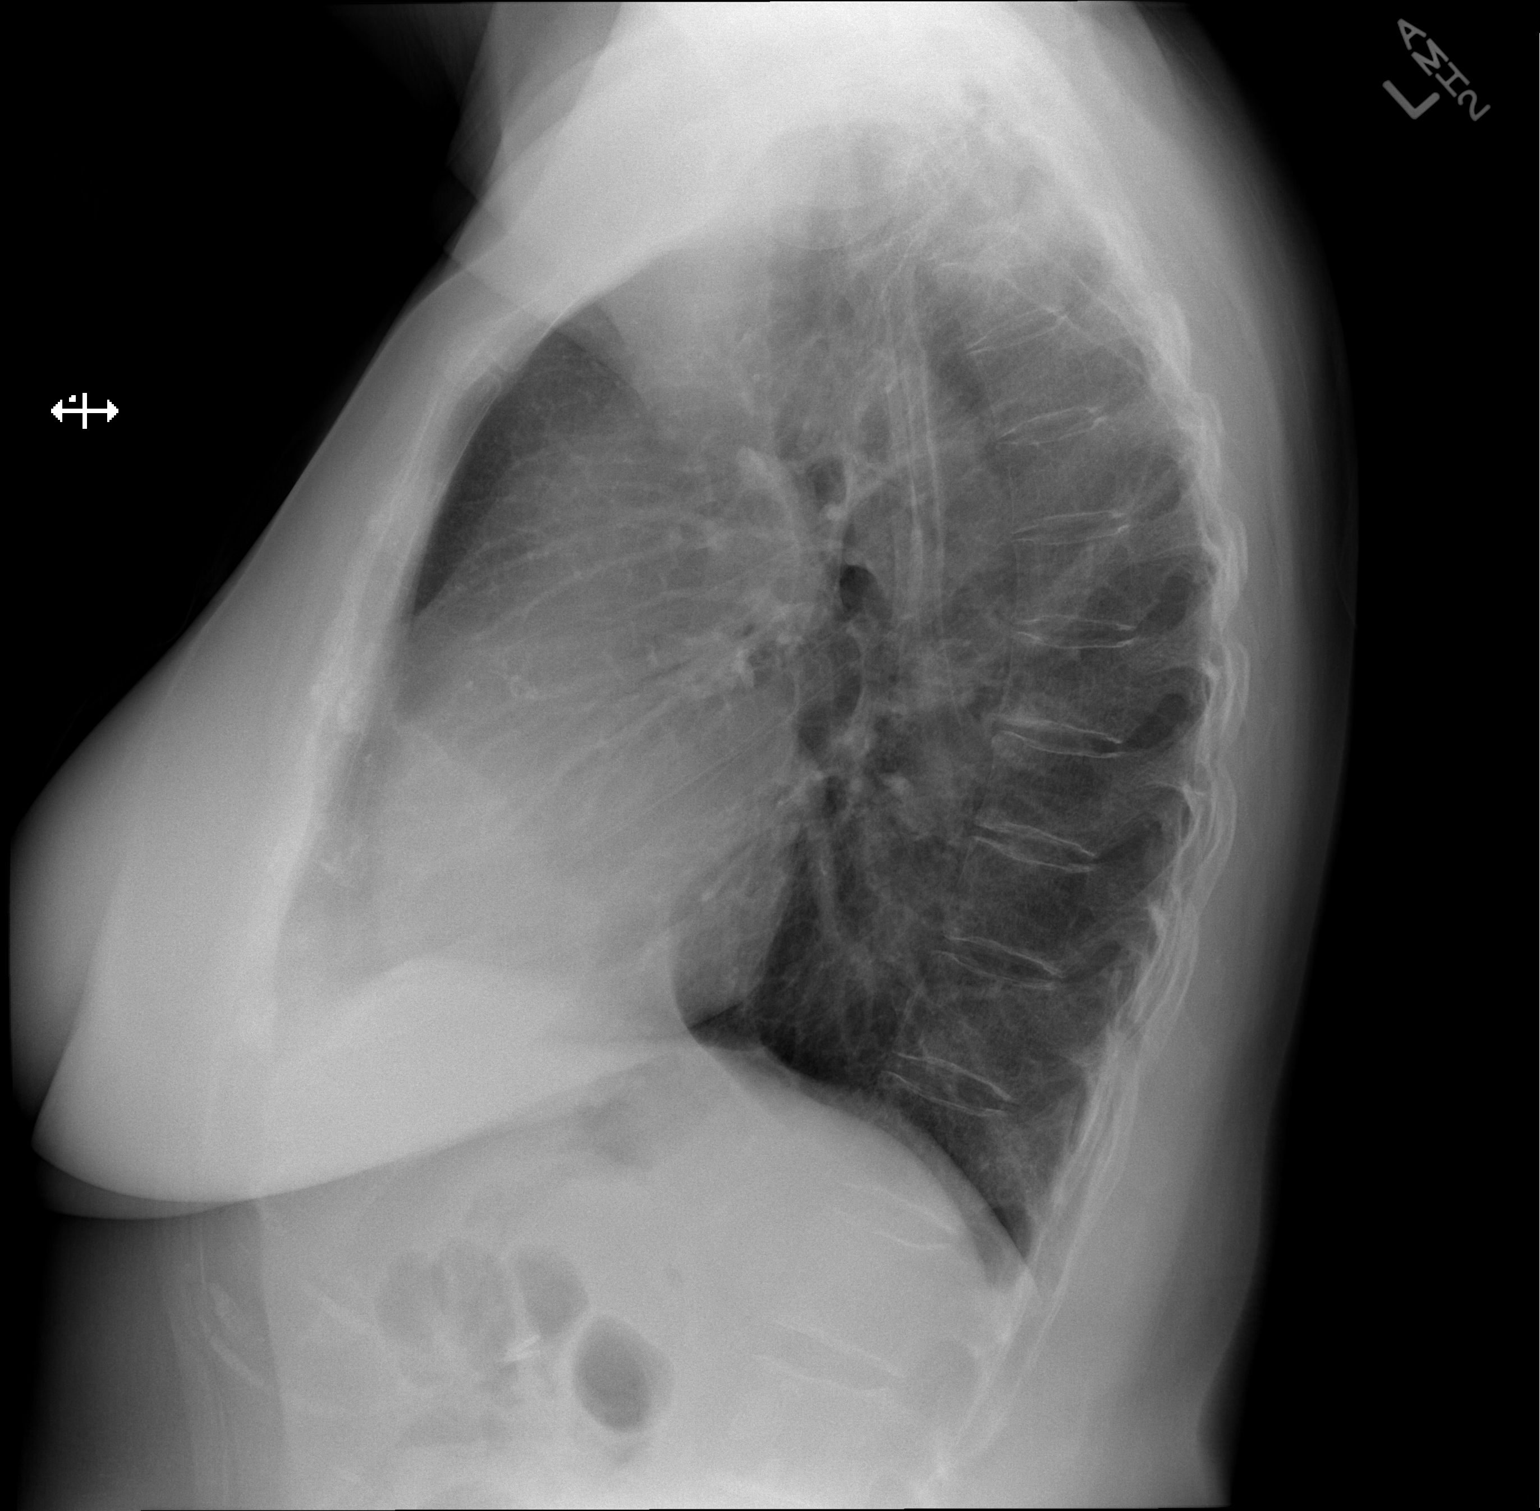

[2 of 2 positions shown; findings below may reference images not displayed]

FINDINGS: The heart is enlarged. Lungs are clear. No pulmonary edema.
Visualized osseous structures have a normal appearance. Surgical
clips are identified in the right upper quadrant of the abdomen.
IMPRESSION: 1. Cardiomegaly without pulmonary edema.
2.  No evidence for acute pulmonary abnormality.

## 2014-10-18 DIAGNOSIS — S335XXA Sprain of ligaments of lumbar spine, initial encounter: Secondary | ICD-10-CM | POA: Diagnosis not present

## 2014-10-28 DIAGNOSIS — I10 Essential (primary) hypertension: Secondary | ICD-10-CM | POA: Diagnosis not present

## 2014-10-28 DIAGNOSIS — Z6825 Body mass index (BMI) 25.0-25.9, adult: Secondary | ICD-10-CM | POA: Diagnosis not present

## 2014-10-28 DIAGNOSIS — M545 Low back pain: Secondary | ICD-10-CM | POA: Diagnosis not present

## 2014-10-28 DIAGNOSIS — M5416 Radiculopathy, lumbar region: Secondary | ICD-10-CM | POA: Diagnosis not present

## 2014-11-25 DIAGNOSIS — Z1231 Encounter for screening mammogram for malignant neoplasm of breast: Secondary | ICD-10-CM | POA: Diagnosis not present

## 2014-11-26 DIAGNOSIS — M109 Gout, unspecified: Secondary | ICD-10-CM | POA: Diagnosis not present

## 2014-11-26 DIAGNOSIS — I10 Essential (primary) hypertension: Secondary | ICD-10-CM | POA: Diagnosis not present

## 2014-11-26 DIAGNOSIS — E782 Mixed hyperlipidemia: Secondary | ICD-10-CM | POA: Diagnosis not present

## 2014-11-26 DIAGNOSIS — J0100 Acute maxillary sinusitis, unspecified: Secondary | ICD-10-CM | POA: Diagnosis not present

## 2015-02-08 DIAGNOSIS — M542 Cervicalgia: Secondary | ICD-10-CM | POA: Diagnosis not present

## 2015-02-08 DIAGNOSIS — M545 Low back pain: Secondary | ICD-10-CM | POA: Diagnosis not present

## 2015-02-08 DIAGNOSIS — I10 Essential (primary) hypertension: Secondary | ICD-10-CM | POA: Diagnosis not present

## 2015-02-08 DIAGNOSIS — Z6825 Body mass index (BMI) 25.0-25.9, adult: Secondary | ICD-10-CM | POA: Diagnosis not present

## 2015-03-03 DIAGNOSIS — Z23 Encounter for immunization: Secondary | ICD-10-CM | POA: Diagnosis not present

## 2015-03-03 DIAGNOSIS — M109 Gout, unspecified: Secondary | ICD-10-CM | POA: Diagnosis not present

## 2015-03-03 DIAGNOSIS — I10 Essential (primary) hypertension: Secondary | ICD-10-CM | POA: Diagnosis not present

## 2015-03-03 DIAGNOSIS — E780 Pure hypercholesterolemia: Secondary | ICD-10-CM | POA: Diagnosis not present

## 2015-03-03 DIAGNOSIS — J301 Allergic rhinitis due to pollen: Secondary | ICD-10-CM | POA: Diagnosis not present

## 2015-03-03 DIAGNOSIS — E782 Mixed hyperlipidemia: Secondary | ICD-10-CM | POA: Diagnosis not present

## 2015-06-14 DIAGNOSIS — R946 Abnormal results of thyroid function studies: Secondary | ICD-10-CM | POA: Diagnosis not present

## 2015-06-14 DIAGNOSIS — Z1211 Encounter for screening for malignant neoplasm of colon: Secondary | ICD-10-CM | POA: Diagnosis not present

## 2015-06-14 DIAGNOSIS — M109 Gout, unspecified: Secondary | ICD-10-CM | POA: Diagnosis not present

## 2015-06-14 DIAGNOSIS — I10 Essential (primary) hypertension: Secondary | ICD-10-CM | POA: Diagnosis not present

## 2015-06-14 DIAGNOSIS — Z23 Encounter for immunization: Secondary | ICD-10-CM | POA: Diagnosis not present

## 2015-06-14 DIAGNOSIS — E780 Pure hypercholesterolemia: Secondary | ICD-10-CM | POA: Diagnosis not present

## 2015-06-14 DIAGNOSIS — E782 Mixed hyperlipidemia: Secondary | ICD-10-CM | POA: Diagnosis not present

## 2015-08-03 DIAGNOSIS — Z1211 Encounter for screening for malignant neoplasm of colon: Secondary | ICD-10-CM | POA: Diagnosis not present

## 2015-08-03 DIAGNOSIS — Z1212 Encounter for screening for malignant neoplasm of rectum: Secondary | ICD-10-CM | POA: Diagnosis not present

## 2015-09-14 DIAGNOSIS — R002 Palpitations: Secondary | ICD-10-CM | POA: Diagnosis not present

## 2015-09-14 DIAGNOSIS — E782 Mixed hyperlipidemia: Secondary | ICD-10-CM | POA: Diagnosis not present

## 2015-09-14 DIAGNOSIS — I10 Essential (primary) hypertension: Secondary | ICD-10-CM | POA: Diagnosis not present

## 2015-09-14 DIAGNOSIS — F5101 Primary insomnia: Secondary | ICD-10-CM | POA: Diagnosis not present

## 2015-09-14 DIAGNOSIS — R946 Abnormal results of thyroid function studies: Secondary | ICD-10-CM | POA: Diagnosis not present

## 2015-09-14 DIAGNOSIS — M1009 Idiopathic gout, multiple sites: Secondary | ICD-10-CM | POA: Diagnosis not present

## 2015-12-20 DIAGNOSIS — I10 Essential (primary) hypertension: Secondary | ICD-10-CM | POA: Diagnosis not present

## 2015-12-20 DIAGNOSIS — E782 Mixed hyperlipidemia: Secondary | ICD-10-CM | POA: Diagnosis not present

## 2015-12-20 DIAGNOSIS — M10071 Idiopathic gout, right ankle and foot: Secondary | ICD-10-CM | POA: Diagnosis not present

## 2015-12-27 DIAGNOSIS — S6991XA Unspecified injury of right wrist, hand and finger(s), initial encounter: Secondary | ICD-10-CM | POA: Diagnosis not present

## 2015-12-27 DIAGNOSIS — M7989 Other specified soft tissue disorders: Secondary | ICD-10-CM | POA: Diagnosis not present

## 2015-12-27 DIAGNOSIS — S62001A Unspecified fracture of navicular [scaphoid] bone of right wrist, initial encounter for closed fracture: Secondary | ICD-10-CM | POA: Diagnosis not present

## 2015-12-30 DIAGNOSIS — S63501A Unspecified sprain of right wrist, initial encounter: Secondary | ICD-10-CM | POA: Diagnosis not present

## 2016-01-10 DIAGNOSIS — I10 Essential (primary) hypertension: Secondary | ICD-10-CM | POA: Diagnosis not present

## 2016-01-17 DIAGNOSIS — J040 Acute laryngitis: Secondary | ICD-10-CM | POA: Diagnosis not present

## 2016-01-17 DIAGNOSIS — J0100 Acute maxillary sinusitis, unspecified: Secondary | ICD-10-CM | POA: Diagnosis not present

## 2016-01-17 DIAGNOSIS — J209 Acute bronchitis, unspecified: Secondary | ICD-10-CM | POA: Diagnosis not present

## 2016-02-07 DIAGNOSIS — R0789 Other chest pain: Secondary | ICD-10-CM | POA: Diagnosis not present

## 2016-02-07 DIAGNOSIS — K591 Functional diarrhea: Secondary | ICD-10-CM | POA: Diagnosis not present

## 2016-02-07 DIAGNOSIS — R42 Dizziness and giddiness: Secondary | ICD-10-CM | POA: Diagnosis not present

## 2016-02-07 DIAGNOSIS — R51 Headache: Secondary | ICD-10-CM | POA: Diagnosis not present

## 2016-02-07 DIAGNOSIS — R5383 Other fatigue: Secondary | ICD-10-CM | POA: Diagnosis not present

## 2016-02-07 DIAGNOSIS — I10 Essential (primary) hypertension: Secondary | ICD-10-CM | POA: Diagnosis not present

## 2016-02-21 DIAGNOSIS — R51 Headache: Secondary | ICD-10-CM | POA: Diagnosis not present

## 2016-02-21 DIAGNOSIS — R0789 Other chest pain: Secondary | ICD-10-CM | POA: Diagnosis not present

## 2016-02-21 DIAGNOSIS — I10 Essential (primary) hypertension: Secondary | ICD-10-CM | POA: Diagnosis not present

## 2016-02-21 DIAGNOSIS — R5383 Other fatigue: Secondary | ICD-10-CM | POA: Diagnosis not present

## 2016-02-21 DIAGNOSIS — K591 Functional diarrhea: Secondary | ICD-10-CM | POA: Diagnosis not present

## 2016-02-21 DIAGNOSIS — R42 Dizziness and giddiness: Secondary | ICD-10-CM | POA: Diagnosis not present

## 2016-03-13 DIAGNOSIS — Z23 Encounter for immunization: Secondary | ICD-10-CM | POA: Diagnosis not present

## 2016-03-13 DIAGNOSIS — Z79899 Other long term (current) drug therapy: Secondary | ICD-10-CM | POA: Diagnosis not present

## 2016-03-13 DIAGNOSIS — Z9181 History of falling: Secondary | ICD-10-CM | POA: Diagnosis not present

## 2016-03-13 DIAGNOSIS — Z1231 Encounter for screening mammogram for malignant neoplasm of breast: Secondary | ICD-10-CM | POA: Diagnosis not present

## 2016-03-13 DIAGNOSIS — I10 Essential (primary) hypertension: Secondary | ICD-10-CM | POA: Diagnosis not present

## 2016-03-13 DIAGNOSIS — Z6826 Body mass index (BMI) 26.0-26.9, adult: Secondary | ICD-10-CM | POA: Diagnosis not present

## 2016-03-13 DIAGNOSIS — E785 Hyperlipidemia, unspecified: Secondary | ICD-10-CM | POA: Diagnosis not present

## 2016-03-13 DIAGNOSIS — M818 Other osteoporosis without current pathological fracture: Secondary | ICD-10-CM | POA: Diagnosis not present

## 2016-03-13 DIAGNOSIS — E663 Overweight: Secondary | ICD-10-CM | POA: Diagnosis not present

## 2016-03-13 DIAGNOSIS — Z1389 Encounter for screening for other disorder: Secondary | ICD-10-CM | POA: Diagnosis not present

## 2016-03-19 DIAGNOSIS — M810 Age-related osteoporosis without current pathological fracture: Secondary | ICD-10-CM | POA: Diagnosis not present

## 2016-03-19 DIAGNOSIS — Z1231 Encounter for screening mammogram for malignant neoplasm of breast: Secondary | ICD-10-CM | POA: Diagnosis not present

## 2016-03-19 DIAGNOSIS — M818 Other osteoporosis without current pathological fracture: Secondary | ICD-10-CM | POA: Diagnosis not present

## 2016-03-27 DIAGNOSIS — I10 Essential (primary) hypertension: Secondary | ICD-10-CM | POA: Diagnosis not present

## 2016-03-27 DIAGNOSIS — E559 Vitamin D deficiency, unspecified: Secondary | ICD-10-CM | POA: Diagnosis not present

## 2016-03-27 DIAGNOSIS — Z6826 Body mass index (BMI) 26.0-26.9, adult: Secondary | ICD-10-CM | POA: Diagnosis not present

## 2016-03-27 DIAGNOSIS — E038 Other specified hypothyroidism: Secondary | ICD-10-CM | POA: Diagnosis not present

## 2016-03-27 DIAGNOSIS — E785 Hyperlipidemia, unspecified: Secondary | ICD-10-CM | POA: Diagnosis not present

## 2016-03-27 DIAGNOSIS — M810 Age-related osteoporosis without current pathological fracture: Secondary | ICD-10-CM | POA: Diagnosis not present

## 2016-06-27 DIAGNOSIS — E663 Overweight: Secondary | ICD-10-CM | POA: Diagnosis not present

## 2016-06-27 DIAGNOSIS — E785 Hyperlipidemia, unspecified: Secondary | ICD-10-CM | POA: Diagnosis not present

## 2016-06-27 DIAGNOSIS — Z6827 Body mass index (BMI) 27.0-27.9, adult: Secondary | ICD-10-CM | POA: Diagnosis not present

## 2016-06-27 DIAGNOSIS — L719 Rosacea, unspecified: Secondary | ICD-10-CM | POA: Diagnosis not present

## 2016-06-27 DIAGNOSIS — Z79899 Other long term (current) drug therapy: Secondary | ICD-10-CM | POA: Diagnosis not present

## 2016-06-27 DIAGNOSIS — M818 Other osteoporosis without current pathological fracture: Secondary | ICD-10-CM | POA: Diagnosis not present

## 2016-06-27 DIAGNOSIS — E038 Other specified hypothyroidism: Secondary | ICD-10-CM | POA: Diagnosis not present

## 2016-06-27 DIAGNOSIS — I10 Essential (primary) hypertension: Secondary | ICD-10-CM | POA: Diagnosis not present

## 2016-06-27 DIAGNOSIS — Z23 Encounter for immunization: Secondary | ICD-10-CM | POA: Diagnosis not present

## 2016-06-27 DIAGNOSIS — E559 Vitamin D deficiency, unspecified: Secondary | ICD-10-CM | POA: Diagnosis not present

## 2016-09-18 DIAGNOSIS — E063 Autoimmune thyroiditis: Secondary | ICD-10-CM | POA: Diagnosis not present

## 2016-09-18 DIAGNOSIS — I10 Essential (primary) hypertension: Secondary | ICD-10-CM | POA: Diagnosis not present

## 2016-09-18 DIAGNOSIS — Z6827 Body mass index (BMI) 27.0-27.9, adult: Secondary | ICD-10-CM | POA: Diagnosis not present

## 2016-09-18 DIAGNOSIS — J028 Acute pharyngitis due to other specified organisms: Secondary | ICD-10-CM | POA: Diagnosis not present

## 2016-09-18 DIAGNOSIS — Z79899 Other long term (current) drug therapy: Secondary | ICD-10-CM | POA: Diagnosis not present

## 2016-09-18 DIAGNOSIS — E559 Vitamin D deficiency, unspecified: Secondary | ICD-10-CM | POA: Diagnosis not present

## 2016-09-18 DIAGNOSIS — J301 Allergic rhinitis due to pollen: Secondary | ICD-10-CM | POA: Diagnosis not present

## 2016-10-20 DIAGNOSIS — J189 Pneumonia, unspecified organism: Secondary | ICD-10-CM | POA: Diagnosis not present

## 2017-01-25 DIAGNOSIS — E663 Overweight: Secondary | ICD-10-CM | POA: Diagnosis not present

## 2017-01-25 DIAGNOSIS — M810 Age-related osteoporosis without current pathological fracture: Secondary | ICD-10-CM | POA: Diagnosis not present

## 2017-01-25 DIAGNOSIS — F419 Anxiety disorder, unspecified: Secondary | ICD-10-CM | POA: Diagnosis not present

## 2017-01-25 DIAGNOSIS — E063 Autoimmune thyroiditis: Secondary | ICD-10-CM | POA: Diagnosis not present

## 2017-01-25 DIAGNOSIS — I10 Essential (primary) hypertension: Secondary | ICD-10-CM | POA: Diagnosis not present

## 2017-01-25 DIAGNOSIS — Z6827 Body mass index (BMI) 27.0-27.9, adult: Secondary | ICD-10-CM | POA: Diagnosis not present

## 2017-01-25 DIAGNOSIS — E785 Hyperlipidemia, unspecified: Secondary | ICD-10-CM | POA: Diagnosis not present

## 2017-01-25 DIAGNOSIS — E559 Vitamin D deficiency, unspecified: Secondary | ICD-10-CM | POA: Diagnosis not present

## 2017-01-25 DIAGNOSIS — Z79899 Other long term (current) drug therapy: Secondary | ICD-10-CM | POA: Diagnosis not present

## 2017-04-26 DIAGNOSIS — L719 Rosacea, unspecified: Secondary | ICD-10-CM | POA: Diagnosis not present

## 2017-04-26 DIAGNOSIS — Z79899 Other long term (current) drug therapy: Secondary | ICD-10-CM | POA: Diagnosis not present

## 2017-04-26 DIAGNOSIS — E785 Hyperlipidemia, unspecified: Secondary | ICD-10-CM | POA: Diagnosis not present

## 2017-04-26 DIAGNOSIS — E559 Vitamin D deficiency, unspecified: Secondary | ICD-10-CM | POA: Diagnosis not present

## 2017-04-26 DIAGNOSIS — Z1389 Encounter for screening for other disorder: Secondary | ICD-10-CM | POA: Diagnosis not present

## 2017-04-26 DIAGNOSIS — I10 Essential (primary) hypertension: Secondary | ICD-10-CM | POA: Diagnosis not present

## 2017-04-26 DIAGNOSIS — F419 Anxiety disorder, unspecified: Secondary | ICD-10-CM | POA: Diagnosis not present

## 2017-04-26 DIAGNOSIS — Z6828 Body mass index (BMI) 28.0-28.9, adult: Secondary | ICD-10-CM | POA: Diagnosis not present

## 2017-04-26 DIAGNOSIS — E063 Autoimmune thyroiditis: Secondary | ICD-10-CM | POA: Diagnosis not present

## 2017-04-26 DIAGNOSIS — Z9181 History of falling: Secondary | ICD-10-CM | POA: Diagnosis not present

## 2017-04-26 DIAGNOSIS — Z1231 Encounter for screening mammogram for malignant neoplasm of breast: Secondary | ICD-10-CM | POA: Diagnosis not present

## 2017-04-30 DIAGNOSIS — Z1231 Encounter for screening mammogram for malignant neoplasm of breast: Secondary | ICD-10-CM | POA: Diagnosis not present

## 2017-07-03 DIAGNOSIS — J029 Acute pharyngitis, unspecified: Secondary | ICD-10-CM | POA: Diagnosis not present

## 2017-07-03 DIAGNOSIS — J9801 Acute bronchospasm: Secondary | ICD-10-CM | POA: Diagnosis not present

## 2017-07-03 DIAGNOSIS — Z6828 Body mass index (BMI) 28.0-28.9, adult: Secondary | ICD-10-CM | POA: Diagnosis not present

## 2017-07-03 DIAGNOSIS — J189 Pneumonia, unspecified organism: Secondary | ICD-10-CM | POA: Diagnosis not present

## 2017-07-29 DIAGNOSIS — E785 Hyperlipidemia, unspecified: Secondary | ICD-10-CM | POA: Diagnosis not present

## 2017-07-29 DIAGNOSIS — I10 Essential (primary) hypertension: Secondary | ICD-10-CM | POA: Diagnosis not present

## 2017-07-29 DIAGNOSIS — Z6828 Body mass index (BMI) 28.0-28.9, adult: Secondary | ICD-10-CM | POA: Diagnosis not present

## 2017-07-29 DIAGNOSIS — Z23 Encounter for immunization: Secondary | ICD-10-CM | POA: Diagnosis not present

## 2017-07-29 DIAGNOSIS — E063 Autoimmune thyroiditis: Secondary | ICD-10-CM | POA: Diagnosis not present

## 2017-07-29 DIAGNOSIS — Z1159 Encounter for screening for other viral diseases: Secondary | ICD-10-CM | POA: Diagnosis not present

## 2017-07-29 DIAGNOSIS — Z1211 Encounter for screening for malignant neoplasm of colon: Secondary | ICD-10-CM | POA: Diagnosis not present

## 2017-09-06 DIAGNOSIS — Z1211 Encounter for screening for malignant neoplasm of colon: Secondary | ICD-10-CM | POA: Diagnosis not present

## 2017-10-30 DIAGNOSIS — L719 Rosacea, unspecified: Secondary | ICD-10-CM | POA: Diagnosis not present

## 2017-10-30 DIAGNOSIS — Z6828 Body mass index (BMI) 28.0-28.9, adult: Secondary | ICD-10-CM | POA: Diagnosis not present

## 2017-10-30 DIAGNOSIS — E063 Autoimmune thyroiditis: Secondary | ICD-10-CM | POA: Diagnosis not present

## 2017-10-30 DIAGNOSIS — E785 Hyperlipidemia, unspecified: Secondary | ICD-10-CM | POA: Diagnosis not present

## 2017-10-30 DIAGNOSIS — I10 Essential (primary) hypertension: Secondary | ICD-10-CM | POA: Diagnosis not present

## 2017-10-30 DIAGNOSIS — Z79899 Other long term (current) drug therapy: Secondary | ICD-10-CM | POA: Diagnosis not present

## 2017-10-30 DIAGNOSIS — R1013 Epigastric pain: Secondary | ICD-10-CM | POA: Diagnosis not present

## 2017-10-30 DIAGNOSIS — F419 Anxiety disorder, unspecified: Secondary | ICD-10-CM | POA: Diagnosis not present

## 2017-10-30 DIAGNOSIS — E559 Vitamin D deficiency, unspecified: Secondary | ICD-10-CM | POA: Diagnosis not present

## 2017-11-01 DIAGNOSIS — R1013 Epigastric pain: Secondary | ICD-10-CM | POA: Diagnosis not present

## 2017-11-01 DIAGNOSIS — N200 Calculus of kidney: Secondary | ICD-10-CM | POA: Diagnosis not present

## 2017-11-13 DIAGNOSIS — E569 Vitamin deficiency, unspecified: Secondary | ICD-10-CM | POA: Diagnosis not present

## 2017-11-13 DIAGNOSIS — Z1211 Encounter for screening for malignant neoplasm of colon: Secondary | ICD-10-CM | POA: Diagnosis not present

## 2017-11-13 DIAGNOSIS — E785 Hyperlipidemia, unspecified: Secondary | ICD-10-CM | POA: Diagnosis not present

## 2017-11-13 DIAGNOSIS — K648 Other hemorrhoids: Secondary | ICD-10-CM | POA: Diagnosis not present

## 2017-11-13 DIAGNOSIS — K5730 Diverticulosis of large intestine without perforation or abscess without bleeding: Secondary | ICD-10-CM | POA: Diagnosis not present

## 2017-11-13 DIAGNOSIS — E039 Hypothyroidism, unspecified: Secondary | ICD-10-CM | POA: Diagnosis not present

## 2017-11-13 DIAGNOSIS — Z7982 Long term (current) use of aspirin: Secondary | ICD-10-CM | POA: Diagnosis not present

## 2017-11-13 DIAGNOSIS — M810 Age-related osteoporosis without current pathological fracture: Secondary | ICD-10-CM | POA: Diagnosis not present

## 2017-11-13 DIAGNOSIS — I10 Essential (primary) hypertension: Secondary | ICD-10-CM | POA: Diagnosis not present

## 2017-11-13 DIAGNOSIS — Z79899 Other long term (current) drug therapy: Secondary | ICD-10-CM | POA: Diagnosis not present

## 2018-01-12 DIAGNOSIS — S139XXA Sprain of joints and ligaments of unspecified parts of neck, initial encounter: Secondary | ICD-10-CM | POA: Diagnosis not present

## 2018-01-16 DIAGNOSIS — M542 Cervicalgia: Secondary | ICD-10-CM | POA: Diagnosis not present

## 2018-01-16 DIAGNOSIS — Z6828 Body mass index (BMI) 28.0-28.9, adult: Secondary | ICD-10-CM | POA: Diagnosis not present

## 2018-01-16 DIAGNOSIS — E663 Overweight: Secondary | ICD-10-CM | POA: Diagnosis not present

## 2018-01-17 DIAGNOSIS — M5011 Cervical disc disorder with radiculopathy,  high cervical region: Secondary | ICD-10-CM | POA: Diagnosis not present

## 2018-01-17 DIAGNOSIS — M4322 Fusion of spine, cervical region: Secondary | ICD-10-CM | POA: Diagnosis not present

## 2018-01-17 DIAGNOSIS — M542 Cervicalgia: Secondary | ICD-10-CM | POA: Diagnosis not present

## 2018-01-17 DIAGNOSIS — Z981 Arthrodesis status: Secondary | ICD-10-CM | POA: Diagnosis not present

## 2018-01-17 DIAGNOSIS — M5021 Other cervical disc displacement,  high cervical region: Secondary | ICD-10-CM | POA: Diagnosis not present

## 2018-01-23 DIAGNOSIS — M4712 Other spondylosis with myelopathy, cervical region: Secondary | ICD-10-CM | POA: Diagnosis not present

## 2018-02-05 DIAGNOSIS — Z981 Arthrodesis status: Secondary | ICD-10-CM | POA: Diagnosis not present

## 2018-02-05 DIAGNOSIS — M5001 Cervical disc disorder with myelopathy,  high cervical region: Secondary | ICD-10-CM | POA: Diagnosis not present

## 2018-02-05 DIAGNOSIS — M5020 Other cervical disc displacement, unspecified cervical region: Secondary | ICD-10-CM | POA: Diagnosis not present

## 2018-02-06 ENCOUNTER — Other Ambulatory Visit: Payer: Self-pay

## 2018-02-06 ENCOUNTER — Emergency Department (HOSPITAL_COMMUNITY): Payer: Medicare Other

## 2018-02-06 ENCOUNTER — Emergency Department (HOSPITAL_COMMUNITY)
Admission: EM | Admit: 2018-02-06 | Discharge: 2018-02-07 | Disposition: A | Discharge status: Home / Self Care | Payer: Medicare Other | Attending: Emergency Medicine | Admitting: Emergency Medicine

## 2018-02-06 DIAGNOSIS — G8918 Other acute postprocedural pain: Secondary | ICD-10-CM | POA: Insufficient documentation

## 2018-02-06 DIAGNOSIS — R1312 Dysphagia, oropharyngeal phase: Secondary | ICD-10-CM | POA: Diagnosis not present

## 2018-02-06 DIAGNOSIS — R221 Localized swelling, mass and lump, neck: Secondary | ICD-10-CM | POA: Diagnosis not present

## 2018-02-06 DIAGNOSIS — R1310 Dysphagia, unspecified: Secondary | ICD-10-CM | POA: Diagnosis not present

## 2018-02-06 DIAGNOSIS — R202 Paresthesia of skin: Secondary | ICD-10-CM | POA: Diagnosis not present

## 2018-02-06 DIAGNOSIS — Z79899 Other long term (current) drug therapy: Secondary | ICD-10-CM | POA: Diagnosis not present

## 2018-02-06 DIAGNOSIS — I10 Essential (primary) hypertension: Secondary | ICD-10-CM | POA: Insufficient documentation

## 2018-02-06 DIAGNOSIS — M542 Cervicalgia: Secondary | ICD-10-CM

## 2018-02-06 LAB — CBC WITH DIFFERENTIAL/PLATELET
Basophils Absolute: 0 10*3/uL (ref 0.0–0.1)
Basophils Relative: 0 %
Eosinophils Absolute: 0 10*3/uL (ref 0.0–0.7)
Eosinophils Relative: 0 %
HCT: 41.6 % (ref 36.0–46.0)
Hemoglobin: 13.9 g/dL (ref 12.0–15.0)
Lymphocytes Relative: 10 %
Lymphs Abs: 1.8 10*3/uL (ref 0.7–4.0)
MCH: 30.2 pg (ref 26.0–34.0)
MCHC: 33.4 g/dL (ref 30.0–36.0)
MCV: 90.2 fL (ref 78.0–100.0)
Monocytes Absolute: 1.3 10*3/uL — ABNORMAL HIGH (ref 0.1–1.0)
Monocytes Relative: 7 %
Neutro Abs: 15.5 10*3/uL — ABNORMAL HIGH (ref 1.7–7.7)
Neutrophils Relative %: 83 %
Platelets: 261 10*3/uL (ref 150–400)
RBC: 4.61 MIL/uL (ref 3.87–5.11)
RDW: 13.2 % (ref 11.5–15.5)
WBC: 18.6 10*3/uL — ABNORMAL HIGH (ref 4.0–10.5)

## 2018-02-06 LAB — BASIC METABOLIC PANEL
Anion gap: 14 (ref 5–15)
BUN: 13 mg/dL (ref 6–20)
CO2: 26 mmol/L (ref 22–32)
Calcium: 9.5 mg/dL (ref 8.9–10.3)
Chloride: 101 mmol/L (ref 101–111)
Creatinine, Ser: 0.98 mg/dL (ref 0.44–1.00)
GFR calc Af Amer: >60 mL/min (ref >60)
GFR calc non Af Amer: 58 mL/min — ABNORMAL LOW (ref >60)
Glucose, Bld: 124 mg/dL — ABNORMAL HIGH (ref 65–99)
Potassium: 3.3 mmol/L — ABNORMAL LOW (ref 3.5–5.1)
Sodium: 141 mmol/L (ref 135–145)

## 2018-02-06 MED ORDER — IOHEXOL 300 MG/ML  SOLN
100.0000 mL | Freq: Once | INTRAMUSCULAR | Status: AC | PRN
  Administered 2018-02-06: 100 mL via INTRAVENOUS

## 2018-02-06 NOTE — ED Triage Notes (Signed)
Patient is one day post op from cervical spine surgery. Here today c/o swelling and difficulty breathing when lying down.

## 2018-02-06 NOTE — ED Provider Notes (Signed)
Patient placed in Quick Look pathway, seen and evaluated   Chief Complaint: Neck pain   HPI:   72 year old female presents today with complaints of neck pain.  She is status post anterior cervical decompression, discectomy fusion 3 levels cervical [id number] performed by Dr. Lovell Sheehan yesterday.  Patient notes that she was told after the surgery that she may have some difficulty swallowing and some bruising.  She notes she is having more bruising than anticipated, reports pressure in her anterior neck and notes that she is having difficulty swallowing.  She notes she is tolerating secretions but finds this difficult, she denies any trouble breathing.  She reports minor pain in the neck nonsevere.  ROS: Neck pain (one)  Physical Exam:   Gen: No distress  Neuro: Awake and Alert  Skin: Warm    Focused Exam: Anterior neck with surgical incision that is clean with no signs of surrounding redness, she has minor bruising surrounding the incision and dependent bruising at the sternal notch with some firmness-oropharynx is clear, no pooling of secretions, voice is normal, no respiratory stress or airway compromise.  Patient in triage awaiting acute bed, CT soft tissue neck, basic labs.  No airway compromise at this time.   Initiation of care has begun. The patient has been counseled on the process, plan, and necessity for staying for the completion/evaluation, and the remainder of the medical screening examination    Rosalio Loud 02/06/18 2157    Vanetta Mulders, MD 02/06/18 2325

## 2018-02-06 NOTE — ED Provider Notes (Signed)
[facility] EMERGENCY DEPARTMENT Provider Note   CSN: [id number] Arrival date & time: 02/06/18  2122     History   Chief Complaint Chief Complaint  Patient presents with  . Neck Swelling    HPI Debra Mullen is a 72 y.o. female.  The history is provided by the patient.  Illness  This is a new problem. The current episode started yesterday. The problem occurs constantly. The problem has been gradually worsening. Pertinent negatives include no chest pain and no shortness of breath. The symptoms are aggravated by swallowing (movement). Nothing relieves the symptoms.  Patient with history of hypercholesterolemia, GERD, arthritis presents with neck pain and swelling.  She reports she had cervical spine surgery yesterday.  Since that time she been having increasing swelling in her neck as well as difficulty swallowing.  No fevers or vomiting She reports improving weakness and numbness in both arms  Past Medical History:  Diagnosis Date  . Arthritis   . Complication of anesthesia   . Elevated cholesterol   . GERD (gastroesophageal reflux disease)   . Hypertension   . Murmur, heart   . Neuromuscular disorder    family hx of Muscular Dystrophy  . PONV (postoperative nausea and vomiting)   . Tinnitus of both ears    hears out of right ear better    Patient Active Problem List   Diagnosis Date Noted  . Spinal stenosis in cervical region 04/15/2014  . Cervical spondylosis with myelopathy and radiculopathy 04/14/2014    Past Surgical History:  Procedure Laterality Date  . ANTERIOR CERVICAL DECOMP/DISCECTOMY FUSION N/A 04/14/2014   Procedure: ANTERIOR CERVICAL DECOMPRESSION/DISCECTOMY FUSION 3 LEVELS cervical four/five, five/six, six/seven;  Surgeon: Cristi Loron, MD;  Location: [facility];  Service: Neurosurgery;  Laterality: N/A;  . APPENDECTOMY    . CHOLECYSTECTOMY    . OVARIAN CYST SURGERY     "yrs' ago--age 54  . TUBAL LIGATION       OB History     None      Home Medications    Prior to Admission medications   Medication Sig Start Date End Date Taking? Authorizing Provider  Ergocalciferol (VITAMIN D2 PO) Take 1.25 mg by mouth 2 (two) times a week. Tuesday and Thursday   Yes [provider]  levothyroxine (SYNTHROID, LEVOTHROID) 25 MCG tablet Take 25 mcg by mouth daily before breakfast.   Yes [provider]  metoprolol succinate (TOPROL-XL) 100 MG 24 hr tablet Take 100 mg by mouth daily. Take with or immediately following a meal.    Yes [provider]  triamterene-hydrochlorothiazide (MAXZIDE-25) 37.5-25 MG tablet Take 1 tablet by mouth daily.   Yes [provider]  cyclobenzaprine (FLEXERIL) 10 MG tablet Take 10 mg by mouth every 8 (eight) hours as needed for muscle spasms.    [provider]  gabapentin (NEURONTIN) 300 MG capsule Take 1 capsule (300 mg total) by mouth 3 (three) times daily. Patient not taking: Reported on 02/06/2018 04/15/14   Tressie Stalker, MD    Family History No family history on file.  Social History Social History   Tobacco Use  . Smoking status: Never Smoker  Substance Use Topics  . Alcohol use: No  . Drug use: No     Allergies   Patient has no known allergies.   Review of Systems Review of Systems  Constitutional: Negative for fever.  HENT: Positive for trouble swallowing.   Respiratory: Negative for shortness of breath.   Cardiovascular:  Negative for chest pain.  Gastrointestinal: Negative for vomiting.  Musculoskeletal: Positive for neck pain. Negative for back pain.  Neurological: Positive for numbness.       Mild headache  All other systems reviewed and are negative.    Physical Exam Updated Vital Signs BP (!) 133/59   Pulse 61   Temp 98.7 F (37.1 C) (Oral)   Resp 18   Ht 1.676 m ( )   Wt 76.7 kg (169 lb)   SpO2 98%   BMI 27.28 kg/m   Physical Exam CONSTITUTIONAL: Well developed/well nourished HEAD:  Normocephalic/atraumatic EYES: EOMI/PERRL ENMT: Mucous membranes moist, voice normal, no stridor, no drooling NECK: Collar in place, no thoracic/lumbar tenderness CV: S1/S2 noted, no murmurs/rubs/gallops noted LUNGS: Lungs are clear to auscultation bilaterally, no apparent distress ABDOMEN: soft GU:no cva tenderness NEURO: Pt is awake/alert/appropriate, moves all extremitiesx4.  No facial droop.   Equal power (5/5) with hand grip, wrist flex/extension.  No focal sensory deficit to light touch is noted in either UE.   EXTREMITIES:  full ROM SKIN: warm, color normal PSYCH: no abnormalities of mood noted, alert and oriented to situation   ED Treatments / Results  Labs (all labs ordered are listed, but only abnormal results are displayed) Labs Reviewed  CBC WITH DIFFERENTIAL/PLATELET - Abnormal; Notable for the following components:      Result Value   WBC 18.6 (*)    Neutro Abs 15.5 (*)    Monocytes Absolute 1.3 (*)    All other components within normal limits  BASIC METABOLIC PANEL - Abnormal; Notable for the following components:   Potassium 3.3 (*)    Glucose, Bld 124 (*)    GFR calc non Af Amer 58 (*)    All other components within normal limits    EKG None  Radiology Ct Soft Tissue Neck W Contrast  Result Date: 02/07/2018 CLINICAL DATA:  Difficulty swallowing. Recent cervical spinal fusion EXAM: CT NECK WITH CONTRAST TECHNIQUE: Multidetector CT imaging of the neck was performed using the standard protocol following the bolus administration of intravenous contrast. CONTRAST:  OMNIPAQUE IOHEXOL 300 MG/ML  SOLN COMPARISON:  None. FINDINGS: PHARYNX AND LARYNX: --Nasopharynx: Fossae of Rosenmuller are clear. Normal adenoid tonsils for age. --Oral cavity and oropharynx: The palatine and lingual tonsils are normal. The visible oral cavity and floor of mouth are normal. --Hypopharynx: Normal vallecula and pyriform sinuses. --Larynx: The laryngeal airway is narrowed at the level  of the true vocal cords, but this may be due to phonation during scan acquisition. No vocal cord edema. --Retropharyngeal space: ACDF hardware extends from C3-C7. There is mild-to-moderate prevertebral soft tissue swelling at these levels. There is postoperative gas more superiorly within the prevertebral space. No abscess or fluid collection. SALIVARY GLANDS: --Parotid: No mass lesion or inflammation. No sialolithiasis or ductal dilatation. --Submandibular: Symmetric without inflammation. No sialolithiasis or ductal dilatation. --Sublingual: Normal. No ranula or other visible lesion of the base of tongue and floor of mouth. THYROID: Normal. LYMPH NODES: No enlarged or abnormal density lymph nodes. VASCULAR: Mild bilateral carotid bifurcation atherosclerotic calcification without stenosis. LIMITED INTRACRANIAL: Normal. VISUALIZED ORBITS: Normal. MASTOIDS AND VISUALIZED PARANASAL SINUSES: No fluid levels or advanced mucosal thickening. No mastoid effusion. SKELETON: ACDF hardware extending from C3-C7. No bony spinal canal stenosis. No acute osseous abnormality. UPPER CHEST: Clear. OTHER: None. IMPRESSION: 1. C3-C7 ACDF with expected postoperative prevertebral soft tissue swelling. No abscess or fluid collection. 2. Focal narrowing of the airway at the level of the vocal cords.  This is probably due to phonation during the scan. If the patient is experiencing difficulty breathing, consider laryngoscopic assessment. Electronically Signed   By: Deatra Robinson M.D.   On: 02/07/2018 00:02    Procedures Procedures (including critical care time)  Medications Ordered in ED Medications - No data to display   Initial Impression / Assessment and Plan / ED Course  I have reviewed the triage vital signs and the nursing notes.  Pertinent labs   results that were available during my care of the patient were reviewed by me and considered in my medical decision making (see chart for details).    Without focal neuro  deficits on my exam.  She denies any incontinence 11:38 PM Patient had been seen in quick look pathway earlier,  to have a CT soft tissue neck ordered.  Please see that note for wound description.   She declines pain meds 1:01 AM CT scan shows no acute findings.  mention of potential narrowing of vocal cords, but likely due to phonation during exam.  Clinically she has no stridor.  Normal phonation.  I had her lay back in the bed, without any change in her voice or breathing pattern.  No hypoxia.  My suspicion for any acute postoperative emergency is low.  No signs of abscess on CT scan.  She feels very comfortable for discharge home.  She will continue her pain medicines.  We discussed strict ER return precautions  Final Clinical Impressions(s) / ED Diagnoses   Final diagnoses:  Neck pain  Postoperative pain after spinal surgery    ED Discharge Orders    None       Zadie Rhine, MD 02/07/18 0102

## 2018-02-07 NOTE — Discharge Instructions (Addendum)
   SEEK IMMEDIATE MEDICAL ATTENTION IF: You develop difficulties swallowing or breathing.  You have new or worse numbness, weakness, tingling, or movement problems in your arms or legs.  You develop increasing pain which is uncontrolled with medications.  You have change in bowel or bladder function, or other concerns.  

## 2018-02-28 DIAGNOSIS — M4712 Other spondylosis with myelopathy, cervical region: Secondary | ICD-10-CM | POA: Diagnosis not present

## 2018-03-06 DIAGNOSIS — Z79899 Other long term (current) drug therapy: Secondary | ICD-10-CM | POA: Diagnosis not present

## 2018-03-06 DIAGNOSIS — E063 Autoimmune thyroiditis: Secondary | ICD-10-CM | POA: Diagnosis not present

## 2018-03-06 DIAGNOSIS — E785 Hyperlipidemia, unspecified: Secondary | ICD-10-CM | POA: Diagnosis not present

## 2018-03-06 DIAGNOSIS — E559 Vitamin D deficiency, unspecified: Secondary | ICD-10-CM | POA: Diagnosis not present

## 2018-06-03 DIAGNOSIS — I10 Essential (primary) hypertension: Secondary | ICD-10-CM | POA: Diagnosis not present

## 2018-06-03 DIAGNOSIS — Z6825 Body mass index (BMI) 25.0-25.9, adult: Secondary | ICD-10-CM | POA: Diagnosis not present

## 2018-06-03 DIAGNOSIS — M542 Cervicalgia: Secondary | ICD-10-CM | POA: Diagnosis not present

## 2018-06-03 DIAGNOSIS — M4712 Other spondylosis with myelopathy, cervical region: Secondary | ICD-10-CM | POA: Diagnosis not present

## 2018-06-10 DIAGNOSIS — I10 Essential (primary) hypertension: Secondary | ICD-10-CM | POA: Diagnosis not present

## 2018-06-10 DIAGNOSIS — E785 Hyperlipidemia, unspecified: Secondary | ICD-10-CM | POA: Diagnosis not present

## 2018-06-10 DIAGNOSIS — L719 Rosacea, unspecified: Secondary | ICD-10-CM | POA: Diagnosis not present

## 2018-06-10 DIAGNOSIS — E559 Vitamin D deficiency, unspecified: Secondary | ICD-10-CM | POA: Diagnosis not present

## 2018-06-10 DIAGNOSIS — E063 Autoimmune thyroiditis: Secondary | ICD-10-CM | POA: Diagnosis not present

## 2018-06-10 DIAGNOSIS — Z79899 Other long term (current) drug therapy: Secondary | ICD-10-CM | POA: Diagnosis not present

## 2018-06-10 DIAGNOSIS — Z9181 History of falling: Secondary | ICD-10-CM | POA: Diagnosis not present

## 2018-06-17 DIAGNOSIS — Z1231 Encounter for screening mammogram for malignant neoplasm of breast: Secondary | ICD-10-CM | POA: Diagnosis not present

## 2018-07-14 DIAGNOSIS — Z23 Encounter for immunization: Secondary | ICD-10-CM | POA: Diagnosis not present

## 2018-07-23 DIAGNOSIS — I10 Essential (primary) hypertension: Secondary | ICD-10-CM | POA: Diagnosis not present

## 2018-07-23 DIAGNOSIS — E039 Hypothyroidism, unspecified: Secondary | ICD-10-CM | POA: Diagnosis not present

## 2018-07-23 DIAGNOSIS — R51 Headache: Secondary | ICD-10-CM | POA: Diagnosis not present

## 2018-07-23 DIAGNOSIS — M542 Cervicalgia: Secondary | ICD-10-CM | POA: Diagnosis not present

## 2018-07-23 DIAGNOSIS — Z7952 Long term (current) use of systemic steroids: Secondary | ICD-10-CM | POA: Diagnosis not present

## 2018-07-23 DIAGNOSIS — Z79899 Other long term (current) drug therapy: Secondary | ICD-10-CM | POA: Diagnosis not present

## 2018-07-23 DIAGNOSIS — M4722 Other spondylosis with radiculopathy, cervical region: Secondary | ICD-10-CM | POA: Diagnosis not present

## 2018-09-17 DIAGNOSIS — E063 Autoimmune thyroiditis: Secondary | ICD-10-CM | POA: Diagnosis not present

## 2018-09-17 DIAGNOSIS — E785 Hyperlipidemia, unspecified: Secondary | ICD-10-CM | POA: Diagnosis not present

## 2018-09-17 DIAGNOSIS — L719 Rosacea, unspecified: Secondary | ICD-10-CM | POA: Diagnosis not present

## 2018-09-17 DIAGNOSIS — I10 Essential (primary) hypertension: Secondary | ICD-10-CM | POA: Diagnosis not present

## 2018-10-28 DIAGNOSIS — I10 Essential (primary) hypertension: Secondary | ICD-10-CM | POA: Diagnosis not present

## 2018-10-28 DIAGNOSIS — Z6826 Body mass index (BMI) 26.0-26.9, adult: Secondary | ICD-10-CM | POA: Diagnosis not present

## 2018-10-28 DIAGNOSIS — M542 Cervicalgia: Secondary | ICD-10-CM | POA: Diagnosis not present

## 2018-10-28 DIAGNOSIS — M4712 Other spondylosis with myelopathy, cervical region: Secondary | ICD-10-CM | POA: Diagnosis not present

## 2019-04-13 DIAGNOSIS — F419 Anxiety disorder, unspecified: Secondary | ICD-10-CM | POA: Diagnosis not present

## 2019-04-13 DIAGNOSIS — R1013 Epigastric pain: Secondary | ICD-10-CM | POA: Diagnosis not present

## 2019-04-13 DIAGNOSIS — L719 Rosacea, unspecified: Secondary | ICD-10-CM | POA: Diagnosis not present

## 2019-04-13 DIAGNOSIS — E063 Autoimmune thyroiditis: Secondary | ICD-10-CM | POA: Diagnosis not present

## 2019-08-12 DIAGNOSIS — B349 Viral infection, unspecified: Secondary | ICD-10-CM | POA: Diagnosis not present

## 2019-08-28 DIAGNOSIS — Z9181 History of falling: Secondary | ICD-10-CM | POA: Diagnosis not present

## 2019-08-28 DIAGNOSIS — E785 Hyperlipidemia, unspecified: Secondary | ICD-10-CM | POA: Diagnosis not present

## 2019-08-28 DIAGNOSIS — Z1331 Encounter for screening for depression: Secondary | ICD-10-CM | POA: Diagnosis not present

## 2019-08-28 DIAGNOSIS — E063 Autoimmune thyroiditis: Secondary | ICD-10-CM | POA: Diagnosis not present

## 2019-08-28 DIAGNOSIS — E559 Vitamin D deficiency, unspecified: Secondary | ICD-10-CM | POA: Diagnosis not present

## 2019-08-28 DIAGNOSIS — M79672 Pain in left foot: Secondary | ICD-10-CM | POA: Diagnosis not present

## 2019-08-28 DIAGNOSIS — R1013 Epigastric pain: Secondary | ICD-10-CM | POA: Diagnosis not present

## 2019-09-04 DIAGNOSIS — M7989 Other specified soft tissue disorders: Secondary | ICD-10-CM | POA: Diagnosis not present

## 2019-09-04 DIAGNOSIS — M79672 Pain in left foot: Secondary | ICD-10-CM | POA: Diagnosis not present

## 2019-09-04 DIAGNOSIS — M25572 Pain in left ankle and joints of left foot: Secondary | ICD-10-CM | POA: Diagnosis not present

## 2019-11-30 DIAGNOSIS — E063 Autoimmune thyroiditis: Secondary | ICD-10-CM | POA: Diagnosis not present

## 2019-11-30 DIAGNOSIS — Z79899 Other long term (current) drug therapy: Secondary | ICD-10-CM | POA: Diagnosis not present

## 2019-11-30 DIAGNOSIS — I10 Essential (primary) hypertension: Secondary | ICD-10-CM | POA: Diagnosis not present

## 2019-11-30 DIAGNOSIS — Z139 Encounter for screening, unspecified: Secondary | ICD-10-CM | POA: Diagnosis not present

## 2019-11-30 DIAGNOSIS — E785 Hyperlipidemia, unspecified: Secondary | ICD-10-CM | POA: Diagnosis not present

## 2019-12-21 DIAGNOSIS — Z23 Encounter for immunization: Secondary | ICD-10-CM | POA: Diagnosis not present

## 2020-01-11 DIAGNOSIS — Z23 Encounter for immunization: Secondary | ICD-10-CM | POA: Diagnosis not present

## 2020-05-18 DIAGNOSIS — R519 Headache, unspecified: Secondary | ICD-10-CM | POA: Diagnosis not present

## 2020-05-18 DIAGNOSIS — M5412 Radiculopathy, cervical region: Secondary | ICD-10-CM | POA: Diagnosis not present

## 2020-05-18 DIAGNOSIS — Z79899 Other long term (current) drug therapy: Secondary | ICD-10-CM | POA: Diagnosis not present

## 2020-05-18 DIAGNOSIS — Z7982 Long term (current) use of aspirin: Secondary | ICD-10-CM | POA: Diagnosis not present

## 2020-05-18 DIAGNOSIS — I10 Essential (primary) hypertension: Secondary | ICD-10-CM | POA: Diagnosis not present

## 2020-05-18 DIAGNOSIS — Z7952 Long term (current) use of systemic steroids: Secondary | ICD-10-CM | POA: Diagnosis not present

## 2020-05-18 DIAGNOSIS — R200 Anesthesia of skin: Secondary | ICD-10-CM | POA: Diagnosis not present

## 2020-05-18 DIAGNOSIS — E785 Hyperlipidemia, unspecified: Secondary | ICD-10-CM | POA: Diagnosis not present

## 2020-05-18 DIAGNOSIS — G629 Polyneuropathy, unspecified: Secondary | ICD-10-CM | POA: Diagnosis not present

## 2020-05-18 DIAGNOSIS — M4312 Spondylolisthesis, cervical region: Secondary | ICD-10-CM | POA: Diagnosis not present

## 2020-05-18 DIAGNOSIS — E039 Hypothyroidism, unspecified: Secondary | ICD-10-CM | POA: Diagnosis not present

## 2020-05-18 DIAGNOSIS — M792 Neuralgia and neuritis, unspecified: Secondary | ICD-10-CM | POA: Diagnosis not present

## 2020-05-18 DIAGNOSIS — G44209 Tension-type headache, unspecified, not intractable: Secondary | ICD-10-CM | POA: Diagnosis not present

## 2020-05-18 DIAGNOSIS — Z981 Arthrodesis status: Secondary | ICD-10-CM | POA: Diagnosis not present

## 2020-05-18 DIAGNOSIS — M25512 Pain in left shoulder: Secondary | ICD-10-CM | POA: Diagnosis not present

## 2020-05-18 DIAGNOSIS — M4322 Fusion of spine, cervical region: Secondary | ICD-10-CM | POA: Diagnosis not present

## 2020-06-02 DIAGNOSIS — F419 Anxiety disorder, unspecified: Secondary | ICD-10-CM | POA: Diagnosis not present

## 2020-06-02 DIAGNOSIS — Z6828 Body mass index (BMI) 28.0-28.9, adult: Secondary | ICD-10-CM | POA: Diagnosis not present

## 2020-06-02 DIAGNOSIS — E782 Mixed hyperlipidemia: Secondary | ICD-10-CM | POA: Diagnosis not present

## 2020-06-02 DIAGNOSIS — Z1231 Encounter for screening mammogram for malignant neoplasm of breast: Secondary | ICD-10-CM | POA: Diagnosis not present

## 2020-06-02 DIAGNOSIS — L719 Rosacea, unspecified: Secondary | ICD-10-CM | POA: Diagnosis not present

## 2020-06-02 DIAGNOSIS — I10 Essential (primary) hypertension: Secondary | ICD-10-CM | POA: Diagnosis not present

## 2020-06-02 DIAGNOSIS — M5412 Radiculopathy, cervical region: Secondary | ICD-10-CM | POA: Diagnosis not present

## 2020-06-02 DIAGNOSIS — R1013 Epigastric pain: Secondary | ICD-10-CM | POA: Diagnosis not present

## 2020-06-02 DIAGNOSIS — E559 Vitamin D deficiency, unspecified: Secondary | ICD-10-CM | POA: Diagnosis not present

## 2020-06-02 DIAGNOSIS — E063 Autoimmune thyroiditis: Secondary | ICD-10-CM | POA: Diagnosis not present

## 2020-06-02 DIAGNOSIS — E785 Hyperlipidemia, unspecified: Secondary | ICD-10-CM | POA: Diagnosis not present

## 2020-09-26 DIAGNOSIS — E559 Vitamin D deficiency, unspecified: Secondary | ICD-10-CM | POA: Diagnosis not present

## 2020-09-26 DIAGNOSIS — Z23 Encounter for immunization: Secondary | ICD-10-CM | POA: Diagnosis not present

## 2020-09-26 DIAGNOSIS — I10 Essential (primary) hypertension: Secondary | ICD-10-CM | POA: Diagnosis not present

## 2020-09-26 DIAGNOSIS — L719 Rosacea, unspecified: Secondary | ICD-10-CM | POA: Diagnosis not present

## 2020-09-26 DIAGNOSIS — F419 Anxiety disorder, unspecified: Secondary | ICD-10-CM | POA: Diagnosis not present

## 2020-09-26 DIAGNOSIS — R739 Hyperglycemia, unspecified: Secondary | ICD-10-CM | POA: Diagnosis not present

## 2020-09-26 DIAGNOSIS — R1013 Epigastric pain: Secondary | ICD-10-CM | POA: Diagnosis not present

## 2020-09-26 DIAGNOSIS — E785 Hyperlipidemia, unspecified: Secondary | ICD-10-CM | POA: Diagnosis not present

## 2020-09-26 DIAGNOSIS — R21 Rash and other nonspecific skin eruption: Secondary | ICD-10-CM | POA: Diagnosis not present

## 2020-09-26 DIAGNOSIS — Z6828 Body mass index (BMI) 28.0-28.9, adult: Secondary | ICD-10-CM | POA: Diagnosis not present

## 2020-09-26 DIAGNOSIS — E063 Autoimmune thyroiditis: Secondary | ICD-10-CM | POA: Diagnosis not present

## 2020-12-28 DIAGNOSIS — R1013 Epigastric pain: Secondary | ICD-10-CM | POA: Diagnosis not present

## 2020-12-28 DIAGNOSIS — I10 Essential (primary) hypertension: Secondary | ICD-10-CM | POA: Diagnosis not present

## 2020-12-28 DIAGNOSIS — E559 Vitamin D deficiency, unspecified: Secondary | ICD-10-CM | POA: Diagnosis not present

## 2020-12-28 DIAGNOSIS — Z6829 Body mass index (BMI) 29.0-29.9, adult: Secondary | ICD-10-CM | POA: Diagnosis not present

## 2020-12-28 DIAGNOSIS — L719 Rosacea, unspecified: Secondary | ICD-10-CM | POA: Diagnosis not present

## 2020-12-28 DIAGNOSIS — E785 Hyperlipidemia, unspecified: Secondary | ICD-10-CM | POA: Diagnosis not present

## 2020-12-28 DIAGNOSIS — R739 Hyperglycemia, unspecified: Secondary | ICD-10-CM | POA: Diagnosis not present

## 2020-12-28 DIAGNOSIS — L290 Pruritus ani: Secondary | ICD-10-CM | POA: Diagnosis not present

## 2020-12-28 DIAGNOSIS — E063 Autoimmune thyroiditis: Secondary | ICD-10-CM | POA: Diagnosis not present

## 2021-01-25 ENCOUNTER — Other Ambulatory Visit: Payer: Self-pay

## 2021-01-25 ENCOUNTER — Encounter: Payer: Self-pay | Admitting: Gastroenterology

## 2021-01-25 ENCOUNTER — Ambulatory Visit (INDEPENDENT_AMBULATORY_CARE_PROVIDER_SITE_OTHER): Payer: Medicare Other | Admitting: Gastroenterology

## 2021-01-25 ENCOUNTER — Other Ambulatory Visit: Payer: Medicare Other

## 2021-01-25 VITALS — BP 168/92 | Ht 66.0 in | Wt 179.2 lb

## 2021-01-25 DIAGNOSIS — Z8619 Personal history of other infectious and parasitic diseases: Secondary | ICD-10-CM | POA: Diagnosis not present

## 2021-01-25 DIAGNOSIS — R197 Diarrhea, unspecified: Secondary | ICD-10-CM | POA: Diagnosis not present

## 2021-01-25 MED ORDER — ALBENDAZOLE 200 MG PO TABS: 400.0000 mg | ORAL_TABLET | ORAL | 0 refills | Status: DC

## 2021-01-25 NOTE — Progress Notes (Signed)
Chief Complaint: [name] problems  Referring Provider: Lavonia Drafts, FNP      ASSESSMENT AND PLAN;   #1. H/O Pinworms  #2. Diarrhea- IBS-D, postcholecystectomy diarrhea. R/O other causes. Neg colon 11/2017 except for diverticulosis.  Plan: -Stool studies for GI Pathogen (includes C. Diff), giardia antigen, O&P and Calprotectin) -Albendazole 400mg  po QD x 1, then rpt in 2 weeks, then again in 2 weeks (#3) -Blood work from . -I encouraged her husband to get well water tested. -Call if any problems. If still problems or worsening left lower quadrant pain, we will proceed with CT abdomen/pelvis   HPI:    Debra Mullen is a 75 y.o. female   C/O pinworms with nocturnal rectal itching.  Per patient she got it from her granddaughter.  She has been struggling with her symptoms for the last 4 to 5 months.  She has tried multiple medications which only help her temporarily.  Longstanding history of diarrhea 4/day worse today, sometimes gets constipated as well, mostly postprandial.  Ever since she had lap chole 2009  They do use well water.  No recent history of travel.  Occ intermittent LLQ pain x 5-6 years, with mild abdominal bloating which gets better with defecation.  No fever or chills.  No wt loss.  In fact he has gained 5 pounds over the last 3 years  Denies having any upper GI symptoms including nausea, vomiting, heartburn, regurgitation, odynophagia or dysphagia.  She recently was seen by 2010, FNP and had blood work which according to patient was all normal.  I do not have those records currently.   Past GI procedures: Colonoscopy 11/13/2017 ([facility]) good preparation.  As a part of colorectal cancer screening.  Moderate sigmoid diverticulosis.  Otherwise normal colonoscopy to TI.  Repeat only if with any new problems Past Medical History:  Diagnosis Date  . Arthritis   . Complication of anesthesia   . Elevated cholesterol   . GERD  (gastroesophageal reflux disease)   . Hypertension   . Hypothyroidism   . Murmur, heart   . Neuromuscular disorder [facility])    family hx of Muscular Dystrophy  . Osteoporosis   . PONV (postoperative nausea and vomiting)   . Rosacea   . Tinnitus of both ears    hears out of right ear better  . Vitamin deficiency     Past Surgical History:  Procedure Laterality Date  . ANTERIOR CERVICAL DECOMP/DISCECTOMY FUSION N/A 04/14/2014   Procedure: ANTERIOR CERVICAL DECOMPRESSION/DISCECTOMY FUSION 3 LEVELS cervical four/five, five/six, six/seven;  Surgeon: 06/15/2014, MD;  Location: [facility];  Service: Neurosurgery;  Laterality: N/A;  . APPENDECTOMY    . CHOLECYSTECTOMY    . COLONOSCOPY  11/13/2017   Moderate sigmoid diverticulosis. Otherwise normal colonoscopy  . OVARIAN CYST SURGERY     "yrs' ago--age 10  . TUBAL LIGATION      No family history on file.  Social History   Tobacco Use  . Smoking status: Never Smoker  . Smokeless tobacco: Never Used  Substance Use Topics  . Alcohol use: No  . Drug use: No    Current Outpatient Medications  Medication Sig Dispense Refill  . cyclobenzaprine (FLEXERIL) 10 MG tablet Take 10 mg by mouth every 8 (eight) hours as needed for muscle spasms.    15 DOXYCYCLINE HYCLATE PO Take 100 mg by mouth 2 (two) times daily.    . furosemide (LASIX) 20 MG tablet Take 20 mg by mouth daily.    Marland Kitchen  metoprolol (TOPROL-XL) 200 MG 24 hr tablet Take 200 mg by mouth daily.    Marland Kitchen spironolactone (ALDACTONE) 25 MG tablet Take 1 tablet by mouth daily.     No current facility-administered medications for this visit.    No Known Allergies  Review of Systems:  Constitutional: Denies fever, chills, diaphoresis, appetite change and fatigue.  HEENT: Denies photophobia, eye pain, redness, hearing loss, ear pain, congestion, sore throat, rhinorrhea, sneezing, mouth sores, neck pain, neck stiffness and tinnitus.   Respiratory: Denies SOB, DOE, cough, chest tightness,   and wheezing.   Cardiovascular: Denies chest pain, palpitations and leg swelling.  Genitourinary: Denies dysuria, urgency, frequency, hematuria, flank pain and difficulty urinating.  Musculoskeletal: Denies myalgias, back pain, joint swelling, arthralgias and gait problem.  Skin: No rash.  Neurological: Denies dizziness, seizures, syncope, weakness, light-headedness, numbness and headaches.  Hematological: Denies adenopathy. Easy bruising, personal or family bleeding history  Psychiatric/Behavioral: Has anxiety or depression     Physical Exam:    BP (!) 168/92 (BP Location: Left Arm, Patient Position: Sitting)   Ht 5\' 6"  (1.676 m)   Wt 179 lb 4 oz (81.3 kg)   BMI 28.93 kg/m  Wt Readings from Last 3 Encounters:  01/25/21 179 lb 4 oz (81.3 kg)  02/06/18 169 lb (76.7 kg)  04/14/14 164 lb 9 oz (74.6 kg)   Constitutional:  Well-developed, in no acute distress. Psychiatric: Normal mood and affect. Behavior is normal. HEENT: Pupils normal.  Conjunctivae are normal. No scleral icterus. Neck supple.  Cardiovascular: Normal rate, regular rhythm. No edema Pulmonary/chest: Effort normal and breath sounds normal. No wheezing, rales or rhonchi. Abdominal: Soft, nondistended. Nontender. Bowel sounds active throughout. There are no masses palpable. No hepatomegaly. Rectal: Deferred Neurological: Alert and oriented to person place and time. Skin: Skin is warm and dry. No rashes noted.  Data Reviewed: I have personally reviewed following labs and imaging studies  CBC: CBC Latest Ref Rng & Units 02/06/2018 04/06/2014 01/16/2007  WBC 4.0 - 10.5 K/uL 18.6(H) 9.7 8.7  Hemoglobin 12.0 - 15.0 g/dL 03/18/2007 50.5 39.7  Hematocrit 36.0 - 46.0 % 41.6 41.7 38.3  Platelets 150 - 400 K/uL 261 200 226    CMP: CMP Latest Ref Rng & Units 02/06/2018 04/06/2014  Glucose 65 - 99 mg/dL 04/08/2014) 99  BUN 6 - 20 mg/dL 13 16  Creatinine 419(F - 1.00 mg/dL 7.90 2.40  Sodium 9.73 - 145 mmol/L 141 141  Potassium 3.5 - 5.1  mmol/L 3.3(L) 4.7  Chloride 101 - 111 mmol/L 101 101  CO2 22 - 32 mmol/L 26 27  Calcium 8.9 - 10.3 mg/dL 9.5 532  Total Protein 6.0 - 8.3 g/dL - 8.0  Total Bilirubin 0.3 - 1.2 mg/dL - 0.3  Alkaline Phos 39 - 117 U/L - 87  AST 0 - 37 U/L - 25  ALT 0 - 35 U/L - 22     99.2, MD 01/25/2021, 11:00 AM  Cc: 01/27/2021, FNP

## 2021-01-25 NOTE — Addendum Note (Signed)
Addended by: Alberteen Sam E on: 01/25/2021 04:10 PM   Modules accepted: Orders

## 2021-01-25 NOTE — Patient Instructions (Signed)
If you are age 73 or older, your body mass index should be between 23-30. Your Body mass index is 28.93 kg/m. If this is out of the aforementioned range listed, please consider follow up with your Primary Care Provider.  If you are age 8 or younger, your body mass index should be between 19-25. Your Body mass index is 28.93 kg/m. If this is out of the aformentioned range listed, please consider follow up with your Primary Care Provider.   Please go to the lab on the 2nd floor [address] before you leave the office today.   We have sent the following medications to your pharmacy for you to pick up at your convenience: Albendazole  Please call with any questions or concerns.  Thank you,  Dr. Lynann Bologna

## 2021-01-30 ENCOUNTER — Other Ambulatory Visit: Payer: Self-pay

## 2021-01-30 ENCOUNTER — Other Ambulatory Visit: Payer: Medicare Other

## 2021-01-30 DIAGNOSIS — Z8619 Personal history of other infectious and parasitic diseases: Secondary | ICD-10-CM | POA: Diagnosis not present

## 2021-01-30 DIAGNOSIS — R197 Diarrhea, unspecified: Secondary | ICD-10-CM | POA: Diagnosis not present

## 2021-02-02 LAB — GI PROFILE, STOOL, PCR

## 2021-02-02 LAB — CALPROTECTIN, FECAL: Calprotectin, Fecal: 47 ug/g (ref 0–120)

## 2021-02-06 ENCOUNTER — Telehealth: Payer: Self-pay | Admitting: General Surgery

## 2021-02-06 DIAGNOSIS — R1032 Left lower quadrant pain: Secondary | ICD-10-CM

## 2021-02-06 NOTE — Telephone Encounter (Signed)
-----   Message from Lynann Bologna, MD sent at 02/02/2021 11:25 AM EDT ----- Please inform the patient. Stool studies are negative Send report to family physician

## 2021-02-06 NOTE — Telephone Encounter (Signed)
Spoke with the patient and she is still having symptoms. She is very confused that nothing was found. She would like to know what else we could do to find out what type of worms she has?

## 2021-02-06 NOTE — Telephone Encounter (Signed)
That is good news-means that she may have cleared her worms If she still having LLQ pain, proceed with CT Abdo/pelvis with p.o. and IV contrast. May need BUN/creatinine before RG

## 2021-02-07 LAB — OVA AND PARASITE EXAMINATION
CONCENTRATE RESULT:: NONE SEEN
MICRO NUMBER:: 11809126
SPECIMEN QUALITY:: ADEQUATE
TRICHROME RESULT:: NONE SEEN

## 2021-02-07 LAB — GIARDIA ANTIGEN
MICRO NUMBER:: 11809125
RESULT:: NOT DETECTED
SPECIMEN QUALITY:: ADEQUATE

## 2021-02-07 NOTE — Telephone Encounter (Signed)
Spoke with Debra Mullen, she will come to [facility] to have her labs drawn with in the next several days and then will go to radiology and schedule her CT abdomen and pelvis.

## 2021-02-09 ENCOUNTER — Other Ambulatory Visit (INDEPENDENT_AMBULATORY_CARE_PROVIDER_SITE_OTHER): Payer: Medicare Other

## 2021-02-09 ENCOUNTER — Other Ambulatory Visit: Payer: Self-pay

## 2021-02-09 DIAGNOSIS — R1032 Left lower quadrant pain: Secondary | ICD-10-CM | POA: Diagnosis not present

## 2021-02-09 LAB — CREATININE, SERUM: Creatinine, Ser: 0.74 mg/dL (ref 0.40–1.20)

## 2021-02-09 LAB — BUN: BUN: 9 mg/dL (ref 6–23)

## 2021-02-10 ENCOUNTER — Ambulatory Visit (HOSPITAL_BASED_OUTPATIENT_CLINIC_OR_DEPARTMENT_OTHER)
Admission: RE | Admit: 2021-02-10 | Discharge: 2021-02-10 | Disposition: A | Discharge status: Home / Self Care | Payer: Medicare Other | Source: Ambulatory Visit | Attending: Gastroenterology | Admitting: Gastroenterology

## 2021-02-10 DIAGNOSIS — K760 Fatty (change of) liver, not elsewhere classified: Secondary | ICD-10-CM | POA: Diagnosis not present

## 2021-02-10 DIAGNOSIS — R1032 Left lower quadrant pain: Secondary | ICD-10-CM | POA: Diagnosis not present

## 2021-02-10 MED ORDER — IOHEXOL 300 MG/ML  SOLN
100.0000 mL | Freq: Once | INTRAMUSCULAR | Status: AC | PRN
  Administered 2021-02-10: 100 mL via INTRAVENOUS

## 2021-02-13 NOTE — Progress Notes (Signed)
CT AP discussed with patient in detail -For diffuse endometrial thickening.  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  Repeat CT in 2 years. -Rest of incidental findings include colonic diverticulosis, small HH, mild fatty liver.  Plan: -Please make appointment with Dr. Lester [place] or Dr. Leota Jacobsen at  [facility] in [place] [phone]. -Also send copy of CT -Patient is aware.  Send report to family physician

## 2021-02-15 NOTE — Telephone Encounter (Signed)
Appointment with Dr Andrey Campanile on 5-25 and they said they have notified patient

## 2021-03-01 DIAGNOSIS — R9389 Abnormal findings on diagnostic imaging of other specified body structures: Secondary | ICD-10-CM | POA: Diagnosis not present

## 2021-03-21 DIAGNOSIS — R9389 Abnormal findings on diagnostic imaging of other specified body structures: Secondary | ICD-10-CM | POA: Diagnosis not present

## 2021-04-03 DIAGNOSIS — R002 Palpitations: Secondary | ICD-10-CM | POA: Diagnosis not present

## 2021-04-03 DIAGNOSIS — R739 Hyperglycemia, unspecified: Secondary | ICD-10-CM | POA: Diagnosis not present

## 2021-04-03 DIAGNOSIS — Z9181 History of falling: Secondary | ICD-10-CM | POA: Diagnosis not present

## 2021-04-03 DIAGNOSIS — E785 Hyperlipidemia, unspecified: Secondary | ICD-10-CM | POA: Diagnosis not present

## 2021-04-03 DIAGNOSIS — E063 Autoimmune thyroiditis: Secondary | ICD-10-CM | POA: Diagnosis not present

## 2021-04-03 DIAGNOSIS — Z1331 Encounter for screening for depression: Secondary | ICD-10-CM | POA: Diagnosis not present

## 2021-04-03 DIAGNOSIS — I10 Essential (primary) hypertension: Secondary | ICD-10-CM | POA: Diagnosis not present

## 2021-04-03 DIAGNOSIS — I722 Aneurysm of renal artery: Secondary | ICD-10-CM | POA: Diagnosis not present

## 2021-04-03 DIAGNOSIS — Z6829 Body mass index (BMI) 29.0-29.9, adult: Secondary | ICD-10-CM | POA: Diagnosis not present

## 2021-04-03 DIAGNOSIS — I700 Atherosclerosis of aorta: Secondary | ICD-10-CM | POA: Diagnosis not present

## 2021-04-03 DIAGNOSIS — M7989 Other specified soft tissue disorders: Secondary | ICD-10-CM | POA: Diagnosis not present

## 2021-04-03 DIAGNOSIS — E559 Vitamin D deficiency, unspecified: Secondary | ICD-10-CM | POA: Diagnosis not present

## 2021-04-05 DIAGNOSIS — N9489 Other specified conditions associated with female genital organs and menstrual cycle: Secondary | ICD-10-CM | POA: Diagnosis not present

## 2021-04-06 ENCOUNTER — Encounter: Payer: Self-pay | Admitting: *Deleted

## 2021-04-06 ENCOUNTER — Encounter: Payer: Self-pay | Admitting: Cardiology

## 2021-04-11 DIAGNOSIS — R002 Palpitations: Secondary | ICD-10-CM | POA: Diagnosis not present

## 2021-04-11 DIAGNOSIS — I361 Nonrheumatic tricuspid (valve) insufficiency: Secondary | ICD-10-CM | POA: Diagnosis not present

## 2021-04-11 DIAGNOSIS — I700 Atherosclerosis of aorta: Secondary | ICD-10-CM | POA: Diagnosis not present

## 2021-04-11 DIAGNOSIS — I340 Nonrheumatic mitral (valve) insufficiency: Secondary | ICD-10-CM | POA: Diagnosis not present

## 2021-04-12 ENCOUNTER — Telehealth: Payer: Self-pay | Admitting: Cardiology

## 2021-04-12 NOTE — Telephone Encounter (Signed)
Abbie from [facility] [place] states she will be faxing over results from a recent echo 7/5. She states it came back stating her left atrium is dilated and right atrium is moderately enlarged. Mitral regurgitation mild to moderate tricuspid and a small paracardial effusion. She would like a call back from a nurse to see if the patient needs a sooner appointment based on these findings. Phone: [phone].

## 2021-04-13 NOTE — Telephone Encounter (Signed)
Spoke to Harrison just now and let her know Dr. Vanetta Shawl recommendations. She verbalizes understanding and thanks me for calling her back.

## 2021-04-20 ENCOUNTER — Encounter: Payer: Self-pay | Admitting: Cardiology

## 2021-04-20 ENCOUNTER — Other Ambulatory Visit: Payer: Self-pay

## 2021-04-20 ENCOUNTER — Ambulatory Visit (INDEPENDENT_AMBULATORY_CARE_PROVIDER_SITE_OTHER): Payer: Medicare Other | Admitting: Cardiology

## 2021-04-20 VITALS — BP 180/70 | HR 55 | Ht 65.5 in | Wt 182.0 lb

## 2021-04-20 DIAGNOSIS — I340 Nonrheumatic mitral (valve) insufficiency: Secondary | ICD-10-CM | POA: Insufficient documentation

## 2021-04-20 DIAGNOSIS — E782 Mixed hyperlipidemia: Secondary | ICD-10-CM

## 2021-04-20 DIAGNOSIS — R0602 Shortness of breath: Secondary | ICD-10-CM | POA: Insufficient documentation

## 2021-04-20 DIAGNOSIS — I361 Nonrheumatic tricuspid (valve) insufficiency: Secondary | ICD-10-CM

## 2021-04-20 DIAGNOSIS — R079 Chest pain, unspecified: Secondary | ICD-10-CM | POA: Insufficient documentation

## 2021-04-20 DIAGNOSIS — I10 Essential (primary) hypertension: Secondary | ICD-10-CM | POA: Diagnosis not present

## 2021-04-20 DIAGNOSIS — R600 Localized edema: Secondary | ICD-10-CM | POA: Diagnosis not present

## 2021-04-20 DIAGNOSIS — I272 Pulmonary hypertension, unspecified: Secondary | ICD-10-CM | POA: Diagnosis not present

## 2021-04-20 HISTORY — DX: Shortness of breath: R06.02

## 2021-04-20 HISTORY — DX: Localized edema: R60.0

## 2021-04-20 HISTORY — DX: Nonrheumatic mitral (valve) insufficiency: I34.0

## 2021-04-20 HISTORY — DX: Nonrheumatic tricuspid (valve) insufficiency: I36.1

## 2021-04-20 HISTORY — DX: Mixed hyperlipidemia: E78.2

## 2021-04-20 HISTORY — DX: Chest pain, unspecified: R07.9

## 2021-04-20 HISTORY — DX: Pulmonary hypertension, unspecified: I27.20

## 2021-04-20 MED ORDER — FUROSEMIDE 20 MG PO TABS: 20.0000 mg | ORAL_TABLET | ORAL | 3 refills | Status: DC

## 2021-04-20 MED ORDER — CARVEDILOL 6.25 MG PO TABS: 6.2500 mg | ORAL_TABLET | Freq: Two times a day (BID) | ORAL | 3 refills | Status: DC

## 2021-04-20 NOTE — Progress Notes (Signed)
Cardiology Office Note:    Date:  04/20/2021   ID:  Debra Mullen, DOB [birth year], MRN [id number]  PCP:  Gordy Councilman, FNP  Cardiologist:  Thomasene Ripple, DO  Electrophysiologist:  None   Referring MD: Gordy Councilman, FNP   Chief Complaint  Patient presents with   Palpitations   Shortness of Breath    History of Present Illness:    Debra Mullen is a 75 y.o. female with a hx of hypertension, hyperlipidemia but is statin intolerant is on Zetia is here today to be evaluated for chest pain.  Patient tells me that over the last several months she has had intermittent chest discomfort.  She described as a midsternal pressure-like sensation.  She tells me comes and goes.  She described that when it comes on, it is in her mid chest sometimes radiates to her left arm.  Lasted for about 10 to 15 minutes prior to resolution.  She has used nitroglycerin in the past and this has helped.  She does admit to associated shortness of breath.  She does tell me she will occasionally have intermittent palpitations.  She denies any lightheadedness or dizziness.  She is here today in the office with her husband.  Past Medical History:  Diagnosis Date   Anxiety    Arthritis    Cervical spondylosis with myelopathy and radiculopathy 04/14/2014   Complication of anesthesia    Elevated cholesterol    Epigastric pain    GERD (gastroesophageal reflux disease)    Hypertension    Hypothyroidism    Murmur, heart    Neuromuscular disorder (HCC)    family hx of Muscular Dystrophy   Osteoporosis    Overweight    PONV (postoperative nausea and vomiting)    Rosacea    Spinal stenosis in cervical region 04/15/2014   Tinnitus of both ears    hears out of right ear better   Vitamin deficiency     Past Surgical History:  Procedure Laterality Date   ANTERIOR CERVICAL DECOMP/DISCECTOMY FUSION N/A 04/14/2014   Procedure: ANTERIOR CERVICAL DECOMPRESSION/DISCECTOMY FUSION 3 LEVELS cervical four/five, five/six,  six/seven;  Surgeon: Cristi Loron, MD;  Location: [facility];  Service: Neurosurgery;  Laterality: N/A;   APPENDECTOMY  1960   CHOLECYSTECTOMY  2009   COLONOSCOPY  11/13/2017   Moderate sigmoid diverticulosis. Otherwise normal colonoscopy   OVARIAN CYST SURGERY     "yrs' ago--age 12   TUBAL LIGATION      Current Medications: Current Meds  Medication Sig   carvedilol (COREG) 6.25 MG tablet Take 1 tablet (6.25 mg total) by mouth 2 (two) times daily.   DOXYCYCLINE HYCLATE PO Take 100 mg by mouth 2 (two) times daily as needed (uti).   ezetimibe (ZETIA) 10 MG tablet Take 10 mg by mouth daily.   furosemide (LASIX) 20 MG tablet Take 1 tablet (20 mg total) by mouth every other day.   omeprazole (PRILOSEC) 20 MG capsule Take 20 mg by mouth daily.   spironolactone (ALDACTONE) 25 MG tablet Take 1 tablet by mouth daily.   [DISCONTINUED] furosemide (LASIX) 20 MG tablet Take 20 mg by mouth daily.   [DISCONTINUED] furosemide (LASIX) 20 MG tablet Take 20 mg by mouth daily as needed for edema.   [DISCONTINUED] metoprolol (TOPROL-XL) 200 MG 24 hr tablet Take 200 mg by mouth daily.     Allergies:   Losartan potassium   Social History   Socioeconomic History   Marital status: Married  Spouse name: Not on file   Number of children: Not on file   Years of education: Not on file   Highest education level: Not on file  Occupational History   Not on file  Tobacco Use   Smoking status: Never   Smokeless tobacco: Never  Substance and Sexual Activity   Alcohol use: No   Drug use: No   Sexual activity: Not on file  Other Topics Concern   Not on file  Social History Narrative   Not on file   Social Determinants of Health   Financial Resource Strain: Not on file  Food Insecurity: Not on file  Transportation Needs: Not on file  Physical Activity: Not on file  Stress: Not on file  Social Connections: Not on file     Family History: The patient's family history includes Cancer in  her brother and father; Diabetes in her brother and sister; Heart attack in her brother and mother; Hypertension in her mother and sister; Stroke in her brother.  ROS:   Review of Systems  Constitution: Negative for decreased appetite, fever and weight gain.  HENT: Negative for congestion, ear discharge, hoarse voice and sore throat.   Eyes: Negative for discharge, redness, vision loss in right eye and visual halos.  Cardiovascular: Reports chest pain, dyspnea on exertion, palpitation.  Negative for leg swelling, orthopnea. Respiratory: Negative for cough, hemoptysis, shortness of breath and snoring.   Endocrine: Negative for heat intolerance and polyphagia.  Hematologic/Lymphatic: Negative for bleeding problem. Does not bruise/bleed easily.  Skin: Negative for flushing, nail changes, rash and suspicious lesions.  Musculoskeletal: Negative for arthritis, joint pain, muscle cramps, myalgias, neck pain and stiffness.  Gastrointestinal: Negative for abdominal pain, bowel incontinence, diarrhea and excessive appetite.  Genitourinary: Negative for decreased libido, genital sores and incomplete emptying.  Neurological: Negative for brief paralysis, focal weakness, headaches and loss of balance.  Psychiatric/Behavioral: Negative for altered mental status, depression and suicidal ideas.  Allergic/Immunologic: Negative for HIV exposure and persistent infections.    EKGs/Labs/Other Studies Reviewed:    The following studies were reviewed today:   EKG:  The ekg ordered today demonstrates sinus bradycardia, heart rate 55 bpm.  Echocardiogram done on April 11, 2021 LVEF 60 to 65%.  The diastolic filling pattern indicates impaired relaxation.  Left atrium severely dilated by volume.  Right atrium is mildly enlarged.  There is mild aortic valve sclerosis.  Moderate to severe mitral regurgitation is present.  Mild to moderate tricuspid regurgitation is present.  Right ventricular systolic pressure 56 mmHg.   There is small generalized pericardial fusion.  Recent Labs: 02/09/2021: BUN 9; Creatinine, Ser 0.74  Recent Lipid Panel No results found for: CHOL, TRIG, HDL, CHOLHDL, VLDL, LDLCALC, LDLDIRECT  Physical Exam:    VS:  BP (!) 180/70 (BP Location: Left Arm, Patient Position: Sitting, Cuff Size: Normal)   Pulse (!) 55   Ht 5' 5.5" (1.664 m)   Wt 182 lb (82.6 kg)   SpO2 99%   BMI 29.83 kg/m     Wt Readings from Last 3 Encounters:  04/20/21 182 lb (82.6 kg)  04/03/21 182 lb (82.6 kg)  01/25/21 179 lb 4 oz (81.3 kg)     GEN: Well nourished, well developed in no acute distress HEENT: Normal NECK: No JVD; No carotid bruits LYMPHATICS: No lymphadenopathy CARDIAC: S1S2 noted,RRR, no murmurs, rubs, gallops RESPIRATORY:  Clear to auscultation without rales, wheezing or rhonchi  ABDOMEN: Soft, non-tender, non-distended, +bowel sounds, no guarding. EXTREMITIES: +1 bilateral leg  edema, No cyanosis, no clubbing MUSCULOSKELETAL:  No deformity  SKIN: Warm and dry NEUROLOGIC:  Alert and oriented x 3, non-focal PSYCHIATRIC:  Normal affect, good insight  ASSESSMENT:    1. Chest pain, unspecified type   2. Hypertension, unspecified type   3. Mixed hyperlipidemia   4. Nonrheumatic tricuspid valve regurgitation   5. Nonrheumatic mitral valve regurgitation   6. Shortness of breath   7. Bilateral leg edema   8. Pulmonary hypertension (HCC)    PLAN:     Her chest discomfort is concerning given the fact that the patient has risk factors for coronary artery disease I am going to set her up for a pharmacologic nuclear stress test.  She is agreeable about proceeding with this.    Her blood pressure is elevated in the office today.  Given the fact that she is also bradycardic and cannot stop the 200 mg of her Toprol-XL and start Coreg 6.25 mg twice a day.  She does have significant bilateral leg edema and with her shortness of breath I have asked the patient to take her Lasix every other  day.  The patient is in agreement with the above plan. The patient left the office in stable condition.  The patient will follow up in   Medication Adjustments/Labs and Tests Ordered: Current medicines are reviewed at length with the patient today.  Concerns regarding medicines are outlined above.  Orders Placed This Encounter  Procedures   MYOCARDIAL PERFUSION IMAGING   EKG 12-Lead   Meds ordered this encounter  Medications   carvedilol (COREG) 6.25 MG tablet    Sig: Take 1 tablet (6.25 mg total) by mouth 2 (two) times daily.    Dispense:  180 tablet    Refill:  3   furosemide (LASIX) 20 MG tablet    Sig: Take 1 tablet (20 mg total) by mouth every other day.    Dispense:  45 tablet    Refill:  3    Patient Instructions  Medication Instructions:  Your physician has recommended you make the following change in your medication:  STOP: Toprol-XL START: Lasix 20 mg every other day START: Coreg 6.25 mg twice daily  *If you need a refill on your cardiac medications before your next appointment, please call your pharmacy*   Lab Work: None If you have labs (blood work) drawn today and your tests are completely normal, you will receive your results only by: MyChart Message (if you have MyChart) OR A paper copy in the mail If you have any lab test that is abnormal or we need to change your treatment, we will call you to review the results.   Testing/Procedures: Your physician has requested that you have a lexiscan myoview. For further information please visit [email or web address] Please follow instruction sheet, as given.    [facility] [address] [place], [place] [address] Phone:  [phone]    Please arrive 15 minutes prior to your appointment time for registration and insurance purposes.  The test will take approximately 3 to 4 hours to complete; you may bring reading material.  If someone comes with you to your appointment, they will  need to remain in the main lobby due to limited space in the testing area. **If you are pregnant or breastfeeding, please notify the nuclear lab prior to your appointment**  How to prepare for your Myocardial Perfusion Test: Do not eat or drink 3 hours prior to your test, except you may have water. Do  not consume products containing caffeine (regular or decaffeinated) 12 hours prior to your test. (ex: coffee, chocolate, sodas, tea). Do bring a list of your current medications with you.  If not listed below, you may take your medications as normal. Do not take metoprolol (Lopressor, Toprol) for 24 hours prior to the test.  Bring the medication to your appointment as you may be required to take it once the test is complete. Do wear comfortable clothes (no dresses or overalls) and walking shoes, tennis shoes preferred (No heels or open toe shoes are allowed). Do NOT wear cologne, perfume, aftershave, or lotions (deodorant is allowed). If these instructions are not followed, your test will have to be rescheduled.  Please report to [address] for your test.  If you have questions or concerns about your appointment, you can call the [facility] at [phone].  If you cannot keep your appointment, please provide 24 hours notification to the Nuclear Lab, to avoid a possible $50 charge to your account.    Follow-Up: At [facility], you and your health needs are our priority.  As part of our continuing mission to provide you with exceptional heart care, we have created designated Provider Care Teams.  These Care Teams include your primary Cardiologist (physician) and Advanced Practice Providers (APPs -  Physician Assistants and Nurse Practitioners) who all work together to provide you with the care you need, when you need it.  We recommend signing up for the patient portal called "MyChart".  Sign up information is provided on this After Visit Summary.  MyChart  is used to connect with patients for Virtual Visits (Telemedicine).  Patients are able to view lab/test results, encounter notes, upcoming appointments, etc.  Non-urgent messages can be sent to your provider as well.   To learn more about what you can do with MyChart, go to [email or web address].    Your next appointment:   4-8 weeks   The format for your next appointment:   In Person  Provider:   Thomasene Ripple, DO   Other Instructions    Adopting a Healthy Lifestyle.  Know what a healthy weight is for you (roughly BMI <25) and aim to maintain this   Aim for 7+ servings of fruits and vegetables daily   65-80+ fluid ounces of water or unsweet tea for healthy kidneys   Limit to max 1 drink of alcohol per day; avoid smoking/tobacco   Limit animal fats in diet for cholesterol and heart health - choose grass fed whenever available   Avoid highly processed foods, and foods high in saturated/trans fats   Aim for low stress - take time to unwind and care for your mental health   Aim for 150 min of moderate intensity exercise weekly for heart health, and weights twice weekly for bone health   Aim for 7-9 hours of sleep daily   When it comes to diets, agreement about the perfect plan isnt easy to find, even among the experts. Experts at the [facility] [employer] developed an idea known as the Healthy Eating Plate. Just imagine a plate divided into logical, healthy portions.   The emphasis is on diet quality:   Load up on vegetables and fruits - one-half of your plate: Aim for color and variety, and remember that potatoes dont count.   Go for whole grains - one-quarter of your plate: Whole wheat, barley, wheat berries, quinoa, oats, brown rice, and foods made with them. If  you want pasta, go with whole wheat pasta.   Protein power - one-quarter of your plate: Fish, chicken, beans, and nuts are all healthy, versatile protein sources. Limit red meat.   The diet, however,  does go beyond the plate, offering a few other suggestions.   Use healthy plant oils, such as olive, canola, soy, corn, sunflower and peanut. Check the labels, and avoid partially hydrogenated oil, which have unhealthy trans fats.   If youre thirsty, drink water. Coffee and tea are good in moderation, but skip sugary drinks and limit milk and dairy products to one or two daily servings.   The type of carbohydrate in the diet is more important than the amount. Some sources of carbohydrates, such as vegetables, fruits, whole grains, and beans-are healthier than others.   Finally, stay active  Signed, Thomasene Ripple, DO  04/20/2021 8:29 PM    [facility]

## 2021-04-20 NOTE — Patient Instructions (Signed)
Medication Instructions:  Your physician has recommended you make the following change in your medication:  STOP: Toprol-XL START: Lasix 20 mg every other day START: Coreg 6.25 mg twice daily  *If you need a refill on your cardiac medications before your next appointment, please call your pharmacy*   Lab Work: None If you have labs (blood work) drawn today and your tests are completely normal, you will receive your results only by: MyChart Message (if you have MyChart) OR A paper copy in the mail If you have any lab test that is abnormal or we need to change your treatment, we will call you to review the results.   Testing/Procedures: Your physician has requested that you have a lexiscan myoview. For further information please visit [email or web address] Please follow instruction sheet, as given.    [facility] [address] [place], [place] [address] Phone:  [phone]    Please arrive 15 minutes prior to your appointment time for registration and insurance purposes.  The test will take approximately 3 to 4 hours to complete; you may bring reading material.  If someone comes with you to your appointment, they will need to remain in the main lobby due to limited space in the testing area. **If you are pregnant or breastfeeding, please notify the nuclear lab prior to your appointment**  How to prepare for your Myocardial Perfusion Test: Do not eat or drink 3 hours prior to your test, except you may have water. Do not consume products containing caffeine (regular or decaffeinated) 12 hours prior to your test. (ex: coffee, chocolate, sodas, tea). Do bring a list of your current medications with you.  If not listed below, you may take your medications as normal. Do not take metoprolol (Lopressor, Toprol) for 24 hours prior to the test.  Bring the medication to your appointment as you may be required to take it once the test is complete. Do wear  comfortable clothes (no dresses or overalls) and walking shoes, tennis shoes preferred (No heels or open toe shoes are allowed). Do NOT wear cologne, perfume, aftershave, or lotions (deodorant is allowed). If these instructions are not followed, your test will have to be rescheduled.  Please report to [address] for your test.  If you have questions or concerns about your appointment, you can call the [facility] at [phone].  If you cannot keep your appointment, please provide 24 hours notification to the Nuclear Lab, to avoid a possible $50 charge to your account.    Follow-Up: At [facility], you and your health needs are our priority.  As part of our continuing mission to provide you with exceptional heart care, we have created designated Provider Care Teams.  These Care Teams include your primary Cardiologist (physician) and Advanced Practice Providers (APPs -  Physician Assistants and Nurse Practitioners) who all work together to provide you with the care you need, when you need it.  We recommend signing up for the patient portal called "MyChart".  Sign up information is provided on this After Visit Summary.  MyChart is used to connect with patients for Virtual Visits (Telemedicine).  Patients are able to view lab/test results, encounter notes, upcoming appointments, etc.  Non-urgent messages can be sent to your provider as well.   To learn more about what you can do with MyChart, go to [email or web address].    Your next appointment:   4-8 weeks   The format for your next appointment:  In Person  Provider:   Berniece Salines, DO   Other Instructions

## 2021-04-25 ENCOUNTER — Other Ambulatory Visit: Payer: Self-pay

## 2021-04-25 ENCOUNTER — Ambulatory Visit (INDEPENDENT_AMBULATORY_CARE_PROVIDER_SITE_OTHER): Payer: Medicare Other

## 2021-04-25 DIAGNOSIS — R079 Chest pain, unspecified: Secondary | ICD-10-CM

## 2021-04-25 MED ORDER — TECHNETIUM TC 99M TETROFOSMIN IV KIT
10.9000 | PACK | Freq: Once | INTRAVENOUS | Status: AC | PRN
Start: 2021-04-25 — End: 2021-04-25
  Administered 2021-04-25: 10.9 via INTRAVENOUS

## 2021-04-25 MED ORDER — REGADENOSON 0.4 MG/5ML IV SOLN
0.4000 mg | Freq: Once | INTRAVENOUS | Status: AC
  Administered 2021-04-25: 0.4 mg via INTRAVENOUS

## 2021-04-25 MED ORDER — TECHNETIUM TC 99M TETROFOSMIN IV KIT
31.1000 | PACK | Freq: Once | INTRAVENOUS | Status: AC | PRN
  Administered 2021-04-25: 31.1 via INTRAVENOUS

## 2021-04-26 LAB — MYOCARDIAL PERFUSION IMAGING
LV dias vol: 81 mL (ref 46–106)
LV sys vol: 35 mL
Peak HR: 92 {beats}/min
Rest HR: 82 {beats}/min
SDS: 7
SRS: 13
SSS: 20
TID: 1.11

## 2021-04-28 ENCOUNTER — Telehealth: Payer: Self-pay | Admitting: Cardiology

## 2021-04-28 NOTE — Telephone Encounter (Signed)
Records sent to Dr. Senaida Ores for pre-op.

## 2021-04-28 NOTE — Telephone Encounter (Signed)
Patient called in to ask that her results from her test be fax to [phone] attn Dr. Senaida Ores. Please advise

## 2021-05-01 ENCOUNTER — Telehealth: Payer: Self-pay

## 2021-05-01 NOTE — Telephone Encounter (Signed)
-----   Message from Robert J Krasowski, MD sent at 05/01/2021  9:10 AM EDT ----- Stress test showing no evidence of ischemia 

## 2021-05-01 NOTE — Telephone Encounter (Signed)
Tried calling patient. No answer and no voicemail set up for me to leave a message. 

## 2021-05-02 ENCOUNTER — Telehealth: Payer: Self-pay

## 2021-05-02 NOTE — Telephone Encounter (Signed)
-----   Message from Georgeanna Lea, MD sent at 05/01/2021  9:10 AM EDT ----- Stress test showing no evidence of ischemia

## 2021-05-02 NOTE — Telephone Encounter (Signed)
Tried calling patient. No answer and no voicemail set up for me to leave a message. 

## 2021-05-03 ENCOUNTER — Telehealth: Payer: Self-pay | Admitting: Cardiology

## 2021-05-03 ENCOUNTER — Telehealth: Payer: Self-pay

## 2021-05-03 NOTE — Telephone Encounter (Signed)
Tried calling patient. No answer and no voicemail set up for me to leave a message. 

## 2021-05-03 NOTE — Telephone Encounter (Signed)
Follow Up:      Patient is returning Morgan's call , concerning her results.

## 2021-05-03 NOTE — Telephone Encounter (Signed)
-----   Message from Robert J Krasowski, MD sent at 05/01/2021  9:10 AM EDT ----- Stress test showing no evidence of ischemia 

## 2021-05-03 NOTE — Telephone Encounter (Signed)
Tried calling patient. No answer and no voicemail set up for me to leave a message.  Letter has been mailed to the patient

## 2021-05-23 ENCOUNTER — Ambulatory Visit: Payer: Medicare Other | Admitting: Cardiology

## 2021-06-06 DIAGNOSIS — N9489 Other specified conditions associated with female genital organs and menstrual cycle: Secondary | ICD-10-CM | POA: Diagnosis not present

## 2021-06-20 DIAGNOSIS — I10 Essential (primary) hypertension: Secondary | ICD-10-CM | POA: Diagnosis not present

## 2021-06-20 DIAGNOSIS — N859 Noninflammatory disorder of uterus, unspecified: Secondary | ICD-10-CM | POA: Diagnosis not present

## 2021-06-20 DIAGNOSIS — K219 Gastro-esophageal reflux disease without esophagitis: Secondary | ICD-10-CM | POA: Diagnosis not present

## 2021-06-20 DIAGNOSIS — N950 Postmenopausal bleeding: Secondary | ICD-10-CM | POA: Diagnosis not present

## 2021-06-20 DIAGNOSIS — N9489 Other specified conditions associated with female genital organs and menstrual cycle: Secondary | ICD-10-CM | POA: Diagnosis not present

## 2021-06-20 DIAGNOSIS — N840 Polyp of corpus uteri: Secondary | ICD-10-CM | POA: Diagnosis not present

## 2021-06-27 ENCOUNTER — Ambulatory Visit: Payer: Medicare Other | Admitting: Cardiology

## 2021-06-27 ENCOUNTER — Other Ambulatory Visit: Payer: Self-pay

## 2021-06-27 DIAGNOSIS — G709 Myoneural disorder, unspecified: Secondary | ICD-10-CM | POA: Insufficient documentation

## 2021-06-27 DIAGNOSIS — E569 Vitamin deficiency, unspecified: Secondary | ICD-10-CM | POA: Insufficient documentation

## 2021-06-27 DIAGNOSIS — E039 Hypothyroidism, unspecified: Secondary | ICD-10-CM | POA: Insufficient documentation

## 2021-06-27 DIAGNOSIS — R112 Nausea with vomiting, unspecified: Secondary | ICD-10-CM | POA: Insufficient documentation

## 2021-06-27 DIAGNOSIS — F419 Anxiety disorder, unspecified: Secondary | ICD-10-CM | POA: Insufficient documentation

## 2021-06-27 DIAGNOSIS — M1990 Unspecified osteoarthritis, unspecified site: Secondary | ICD-10-CM | POA: Insufficient documentation

## 2021-06-27 DIAGNOSIS — K219 Gastro-esophageal reflux disease without esophagitis: Secondary | ICD-10-CM | POA: Insufficient documentation

## 2021-06-27 DIAGNOSIS — R011 Cardiac murmur, unspecified: Secondary | ICD-10-CM | POA: Insufficient documentation

## 2021-06-27 DIAGNOSIS — E663 Overweight: Secondary | ICD-10-CM | POA: Insufficient documentation

## 2021-06-27 DIAGNOSIS — Z9889 Other specified postprocedural states: Secondary | ICD-10-CM | POA: Insufficient documentation

## 2021-06-27 DIAGNOSIS — M810 Age-related osteoporosis without current pathological fracture: Secondary | ICD-10-CM | POA: Insufficient documentation

## 2021-06-27 DIAGNOSIS — L719 Rosacea, unspecified: Secondary | ICD-10-CM | POA: Insufficient documentation

## 2021-06-27 DIAGNOSIS — E7800 Pure hypercholesterolemia, unspecified: Secondary | ICD-10-CM | POA: Insufficient documentation

## 2021-06-27 DIAGNOSIS — T8859XA Other complications of anesthesia, initial encounter: Secondary | ICD-10-CM | POA: Insufficient documentation

## 2021-06-27 DIAGNOSIS — H9313 Tinnitus, bilateral: Secondary | ICD-10-CM | POA: Insufficient documentation

## 2021-06-27 DIAGNOSIS — R1013 Epigastric pain: Secondary | ICD-10-CM | POA: Insufficient documentation

## 2021-06-29 ENCOUNTER — Encounter: Payer: Self-pay | Admitting: Cardiology

## 2021-06-29 ENCOUNTER — Ambulatory Visit (INDEPENDENT_AMBULATORY_CARE_PROVIDER_SITE_OTHER): Payer: Medicare Other | Admitting: Cardiology

## 2021-06-29 ENCOUNTER — Other Ambulatory Visit: Payer: Self-pay

## 2021-06-29 VITALS — BP 172/68 | HR 72 | Ht 65.6 in | Wt 172.6 lb

## 2021-06-29 DIAGNOSIS — I340 Nonrheumatic mitral (valve) insufficiency: Secondary | ICD-10-CM

## 2021-06-29 DIAGNOSIS — I361 Nonrheumatic tricuspid (valve) insufficiency: Secondary | ICD-10-CM | POA: Diagnosis not present

## 2021-06-29 DIAGNOSIS — I10 Essential (primary) hypertension: Secondary | ICD-10-CM

## 2021-06-29 MED ORDER — CARVEDILOL 12.5 MG PO TABS: 12.5000 mg | ORAL_TABLET | Freq: Two times a day (BID) | ORAL | 3 refills | Status: DC

## 2021-06-29 NOTE — Progress Notes (Signed)
Cardiology Office Note:    Date:  06/29/2021   ID:  Debra Mullen, DOB 31-Jan-1946, MRN [id number]  PCP:  Gordy Councilman, FNP  Cardiologist:  Thomasene Ripple, DO  Electrophysiologist:  None   Referring MD: Gordy Councilman, FNP     History of Present Illness:    Debra Mullen is a 75 y.o. female with a hx of hypertension, hyperlipidemia with statin intolerant is on Zetia, mitral regurgitation, tricuspid regurgitation is here today for follow-up visit.  Saw the patient in July 2022 at that time she was experiencing some intermittent chest discomfort I sent the patient for stress test.  In the interim she was able to get a stress test this was normal.  During that visit also she her blood pressure was elevated we will stop the Toprol-XL and start her on Coreg.  At that time I also encouraged the patient to take her Lasix every other day.  Today she tells me that she has been under a lot of stress her husband was recently diagnosed with cancer and he has been told that he does not have long to live.  Past Medical History:  Diagnosis Date   Anxiety    Arthritis    Bilateral leg edema 04/20/2021   Cervical spondylosis with myelopathy and radiculopathy 04/14/2014   Chest pain 04/20/2021   Complication of anesthesia    Elevated cholesterol    Epigastric pain    GERD (gastroesophageal reflux disease)    Hypertension    Hypothyroidism    Mixed hyperlipidemia 04/20/2021   Murmur, heart    Neuromuscular disorder (HCC)    family hx of Muscular Dystrophy   Nonrheumatic mitral valve regurgitation 04/20/2021   Nonrheumatic tricuspid valve regurgitation 04/20/2021   Osteoporosis    Overweight    PONV (postoperative nausea and vomiting)    Pulmonary hypertension (HCC) 04/20/2021   Rosacea    Shortness of breath 04/20/2021   Spinal stenosis in cervical region 04/15/2014   Tinnitus of both ears    hears out of right ear better   Vitamin deficiency     Past Surgical History:  Procedure Laterality  Date   ANTERIOR CERVICAL DECOMP/DISCECTOMY FUSION N/A 04/14/2014   Procedure: ANTERIOR CERVICAL DECOMPRESSION/DISCECTOMY FUSION 3 LEVELS cervical four/five, five/six, six/seven;  Surgeon: Cristi Loron, MD;  Location: [facility];  Service: Neurosurgery;  Laterality: N/A;   APPENDECTOMY  1960   CHOLECYSTECTOMY  2009   COLONOSCOPY  11/13/2017   Moderate sigmoid diverticulosis. Otherwise normal colonoscopy   OVARIAN CYST SURGERY     "yrs' ago--age 51   TUBAL LIGATION      Current Medications: Current Meds  Medication Sig   carvedilol (COREG) 12.5 MG tablet Take 1 tablet (12.5 mg total) by mouth 2 (two) times daily.   furosemide (LASIX) 20 MG tablet Take 1 tablet (20 mg total) by mouth every other day.   omeprazole (PRILOSEC) 20 MG capsule Take 20 mg by mouth daily.   spironolactone (ALDACTONE) 25 MG tablet Take 1 tablet by mouth daily.   [DISCONTINUED] carvedilol (COREG) 6.25 MG tablet Take 1 tablet (6.25 mg total) by mouth 2 (two) times daily.     Allergies:   Losartan potassium   Social History   Socioeconomic History   Marital status: Married    Spouse name: Not on file   Number of children: Not on file   Years of education: Not on file   Highest education level: Not on file  Occupational History  Not on file  Tobacco Use   Smoking status: Never   Smokeless tobacco: Never  Substance and Sexual Activity   Alcohol use: No   Drug use: No   Sexual activity: Not on file  Other Topics Concern   Not on file  Social History Narrative   Not on file   Social Determinants of Health   Financial Resource Strain: Not on file  Food Insecurity: Not on file  Transportation Needs: Not on file  Physical Activity: Not on file  Stress: Not on file  Social Connections: Not on file     Family History: The patient's family history includes Cancer in her brother and father; Diabetes in her brother and sister; Heart attack in her brother and mother; Hypertension in her mother  and sister; Stroke in her brother.  ROS:   Review of Systems  Constitution: Negative for decreased appetite, fever and weight gain.  HENT: Negative for congestion, ear discharge, hoarse voice and sore throat.   Eyes: Negative for discharge, redness, vision loss in right eye and visual halos.  Cardiovascular: Negative for chest pain, dyspnea on exertion, leg swelling, orthopnea and palpitations.  Respiratory: Negative for cough, hemoptysis, shortness of breath and snoring.   Endocrine: Negative for heat intolerance and polyphagia.  Hematologic/Lymphatic: Negative for bleeding problem. Does not bruise/bleed easily.  Skin: Negative for flushing, nail changes, rash and suspicious lesions.  Musculoskeletal: Negative for arthritis, joint pain, muscle cramps, myalgias, neck pain and stiffness.  Gastrointestinal: Negative for abdominal pain, bowel incontinence, diarrhea and excessive appetite.  Genitourinary: Negative for decreased libido, genital sores and incomplete emptying.  Neurological: Negative for brief paralysis, focal weakness, headaches and loss of balance.  Psychiatric/Behavioral: Negative for altered mental status, depression and suicidal ideas.  Allergic/Immunologic: Negative for HIV exposure and persistent infections.    EKGs/Labs/Other Studies Reviewed:    The following studies were reviewed today:   EKG: None today  Echocardiogram done on April 11, 2021 LVEF 60 to 65%.  The diastolic filling pattern indicates impaired relaxation.  Left atrium severely dilated by volume.  Right atrium is mildly enlarged.  There is mild aortic valve sclerosis.  Moderate to severe mitral regurgitation is present.  Mild to moderate tricuspid regurgitation is present.  Right ventricular systolic pressure 56 mmHg.  There is small generalized pericardial effusion.  Pharmacologic nuclear stress test July 2022 Nuclear stress EF: 57%. The left ventricular ejection fraction is normal (55-65%). There was  no ST segment deviation noted during stress. Defect 1: There is a medium defect of moderate severity present in the basal anteroseptal and mid anteroseptal location. Suggestive of breast attenuation. The study is normal. This is a low risk study.    Recent Labs: 02/09/2021: BUN 9; Creatinine, Ser 0.74  Recent Lipid Panel No results found for: CHOL, TRIG, HDL, CHOLHDL, VLDL, LDLCALC, LDLDIRECT  Physical Exam:    VS:  BP (!) 172/68   Pulse 72   Ht 5' 5.6" (1.666 m)   Wt 172 lb 9.6 oz (78.3 kg)   SpO2 97%   BMI 28.20 kg/m     Wt Readings from Last 3 Encounters:  06/29/21 172 lb 9.6 oz (78.3 kg)  04/25/21 182 lb (82.6 kg)  04/20/21 182 lb (82.6 kg)     GEN: Well nourished, well developed in no acute distress HEENT: Normal NECK: No JVD; No carotid bruits LYMPHATICS: No lymphadenopathy CARDIAC: S1S2 noted,RRR, no murmurs, rubs, gallops RESPIRATORY:  Clear to auscultation without rales, wheezing or rhonchi  ABDOMEN: Soft,  non-tender, non-distended, +bowel sounds, no guarding. EXTREMITIES: No edema, No cyanosis, no clubbing MUSCULOSKELETAL:  No deformity  SKIN: Warm and dry NEUROLOGIC:  Alert and oriented x 3, non-focal PSYCHIATRIC:  Normal affect, good insight  ASSESSMENT:    1. Hypertension, unspecified type   2. Nonrheumatic mitral valve regurgitation   3. Nonrheumatic tricuspid valve regurgitation    PLAN:      She is hypertensive today I like to increase her carvedilol to 12.5 mg twice a day.  Her leg edema has improved greatly she will stay on her Lasix 20 mg every other day as well as her Aldactone.  She has had significant allergy to ARB.   The patient is in agreement with the above plan. The patient left the office in stable condition.  The patient will follow up in 3 months with Dr. Bing Matter   Medication Adjustments/Labs and Tests Ordered: Current medicines are reviewed at length with the patient today.  Concerns regarding medicines are outlined above.  No  orders of the defined types were placed in this encounter.  Meds ordered this encounter  Medications   carvedilol (COREG) 12.5 MG tablet    Sig: Take 1 tablet (12.5 mg total) by mouth 2 (two) times daily.    Dispense:  180 tablet    Refill:  3    Patient Instructions  Medication Instructions:  Your physician has recommended you make the following change in your medication:  INCREASE: Coreg 12.5 mg twice daily *If you need a refill on your cardiac medications before your next appointment, please call your pharmacy*   Lab Work: None If you have labs (blood work) drawn today and your tests are completely normal, you will receive your results only by: MyChart Message (if you have MyChart) OR A paper copy in the mail If you have any lab test that is abnormal or we need to change your treatment, we will call you to review the results.   Testing/Procedures: None   Follow-Up: At [facility], you and your health needs are our priority.  As part of our continuing mission to provide you with exceptional heart care, we have created designated Provider Care Teams.  These Care Teams include your primary Cardiologist (physician) and Advanced Practice Providers (APPs -  Physician Assistants and Nurse Practitioners) who all work together to provide you with the care you need, when you need it.  We recommend signing up for the patient portal called "MyChart".  Sign up information is provided on this After Visit Summary.  MyChart is used to connect with patients for Virtual Visits (Telemedicine).  Patients are able to view lab/test results, encounter notes, upcoming appointments, etc.  Non-urgent messages can be sent to your provider as well.   To learn more about what you can do with MyChart, go to [email or web address].    Your next appointment:   3 month(s)  The format for your next appointment:   In Person  Provider:   Gypsy Balsam, MD   Other Instructions     Adopting a  Healthy Lifestyle.  Know what a healthy weight is for you (roughly BMI <25) and aim to maintain this   Aim for 7+ servings of fruits and vegetables daily   65-80+ fluid ounces of water or unsweet tea for healthy kidneys   Limit to max 1 drink of alcohol per day; avoid smoking/tobacco   Limit animal fats in diet for cholesterol and heart health - choose grass fed whenever available   Avoid highly  processed foods, and foods high in saturated/trans fats   Aim for low stress - take time to unwind and care for your mental health   Aim for 150 min of moderate intensity exercise weekly for heart health, and weights twice weekly for bone health   Aim for 7-9 hours of sleep daily   When it comes to diets, agreement about the perfect plan isnt easy to find, even among the experts. Experts at the [facility] [employer] developed an idea known as the Healthy Eating Plate. Just imagine a plate divided into logical, healthy portions.   The emphasis is on diet quality:   Load up on vegetables and fruits - one-half of your plate: Aim for color and variety, and remember that potatoes dont count.   Go for whole grains - one-quarter of your plate: Whole wheat, barley, wheat berries, quinoa, oats, brown rice, and foods made with them. If you want pasta, go with whole wheat pasta.   Protein power - one-quarter of your plate: Fish, chicken, beans, and nuts are all healthy, versatile protein sources. Limit red meat.   The diet, however, does go beyond the plate, offering a few other suggestions.   Use healthy plant oils, such as olive, canola, soy, corn, sunflower and peanut. Check the labels, and avoid partially hydrogenated oil, which have unhealthy trans fats.   If youre thirsty, drink water. Coffee and tea are good in moderation, but skip sugary drinks and limit milk and dairy products to one or two daily servings.   The type of carbohydrate in the diet is more important than the  amount. Some sources of carbohydrates, such as vegetables, fruits, whole grains, and beans-are healthier than others.   Finally, stay active  Signed, Thomasene Ripple, DO  06/29/2021 8:53 AM    [facility]

## 2021-06-29 NOTE — Patient Instructions (Signed)
Medication Instructions:  Your physician has recommended you make the following change in your medication:  INCREASE: Coreg 12.5 mg twice daily *If you need a refill on your cardiac medications before your next appointment, please call your pharmacy*   Lab Work: None If you have labs (blood work) drawn today and your tests are completely normal, you will receive your results only by: MyChart Message (if you have MyChart) OR A paper copy in the mail If you have any lab test that is abnormal or we need to change your treatment, we will call you to review the results.   Testing/Procedures: None   Follow-Up: At [facility], you and your health needs are our priority.  As part of our continuing mission to provide you with exceptional heart care, we have created designated Provider Care Teams.  These Care Teams include your primary Cardiologist (physician) and Advanced Practice Providers (APPs -  Physician Assistants and Nurse Practitioners) who all work together to provide you with the care you need, when you need it.  We recommend signing up for the patient portal called "MyChart".  Sign up information is provided on this After Visit Summary.  MyChart is used to connect with patients for Virtual Visits (Telemedicine).  Patients are able to view lab/test results, encounter notes, upcoming appointments, etc.  Non-urgent messages can be sent to your provider as well.   To learn more about what you can do with MyChart, go to [email or web address].    Your next appointment:   3 month(s)  The format for your next appointment:   In Person  Provider:   Gypsy Balsam, MD   Other Instructions

## 2021-07-08 IMAGING — CT CT ABD-PELV W/ CM
2 of 5 series · 16 of 46 positions shown, 18 images · IV contrast (Omnipaque)
Comparison: Ultrasound on 10/22/2017

CLINICAL DATA: Left lower quadrant pain and diarrhea for several
weeks.

EXAM:
CT ABDOMEN AND PELVIS WITH CONTRAST
TECHNIQUE: Multidetector CT imaging of the abdomen and pelvis was performed
using the standard protocol following bolus administration of
intravenous contrast.
CONTRAST:  100mL OMNIPAQUE IOHEXOL 300 MG/ML  SOLN

[Series 2: axial st · axial · 0.98mm/px · z∈[-472,-78]mm · 13 of 89 slices shown, 15 images]
[im 5/89  soft-tissue]
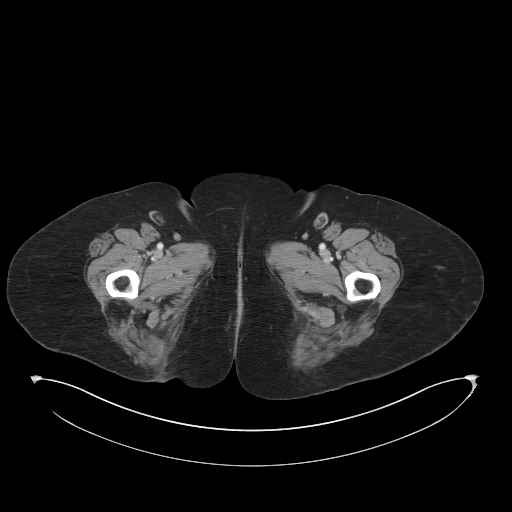
[im 5/89  bone]
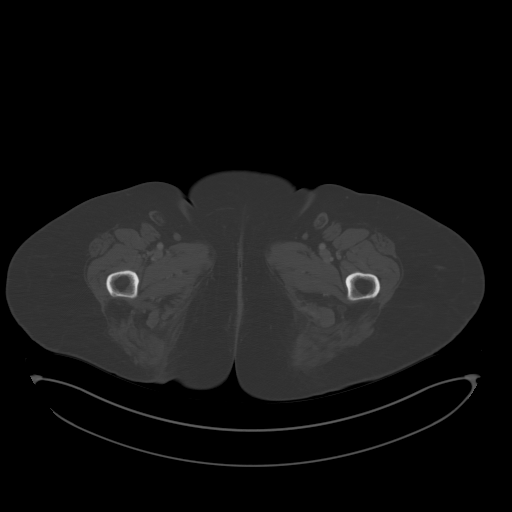
[im 14/89  soft-tissue]
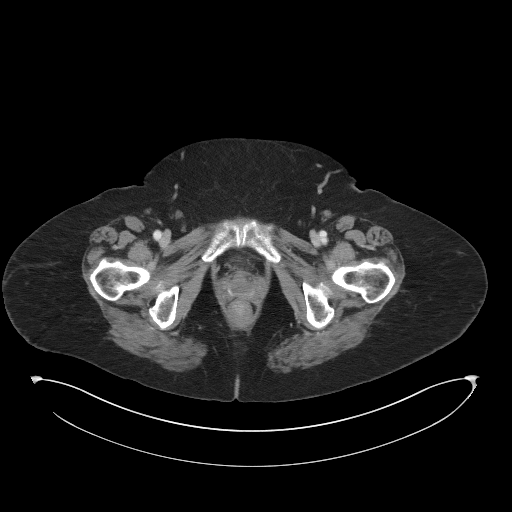
[im 19/89  soft-tissue]
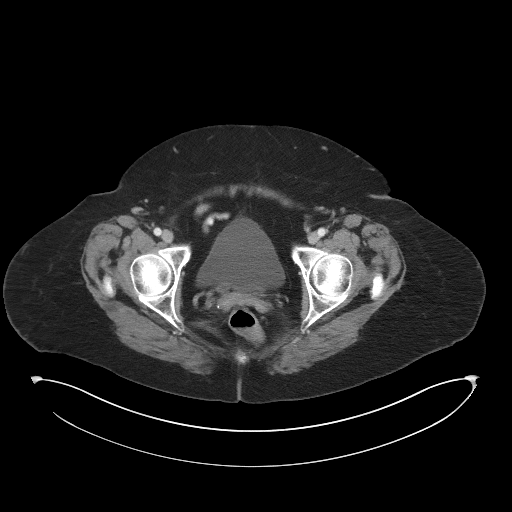
[im 24/89  soft-tissue]
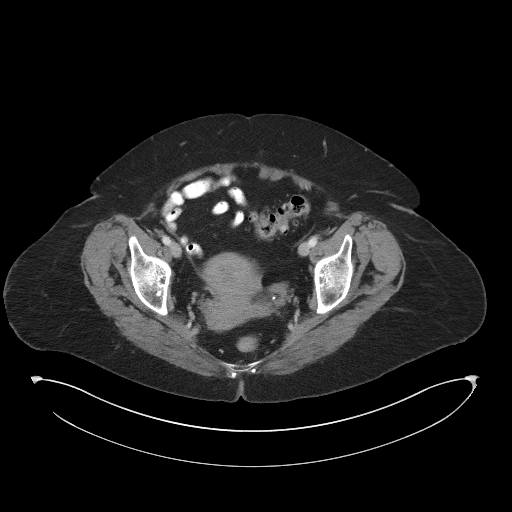
[im 33/89  soft-tissue]
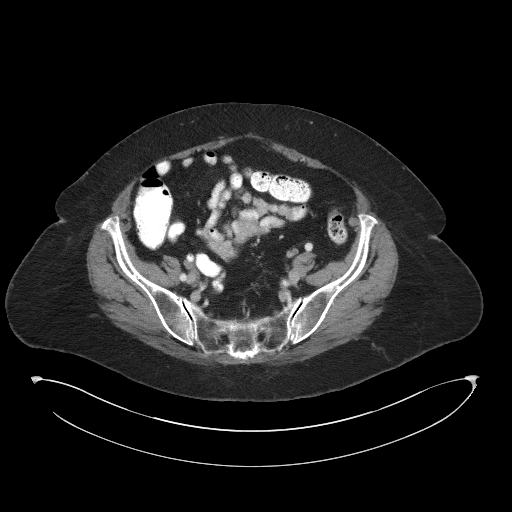
[im 38/89  soft-tissue]
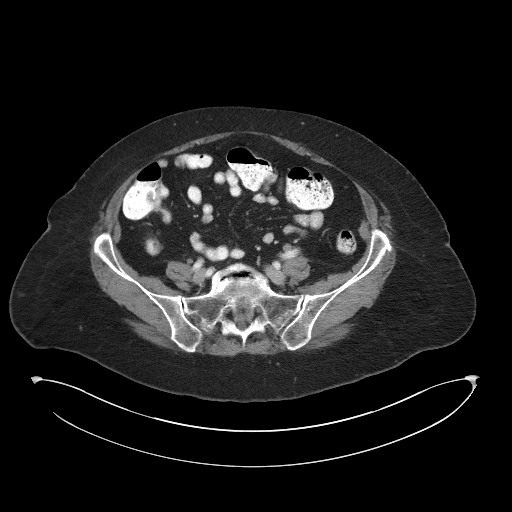
[im 47/89  soft-tissue]
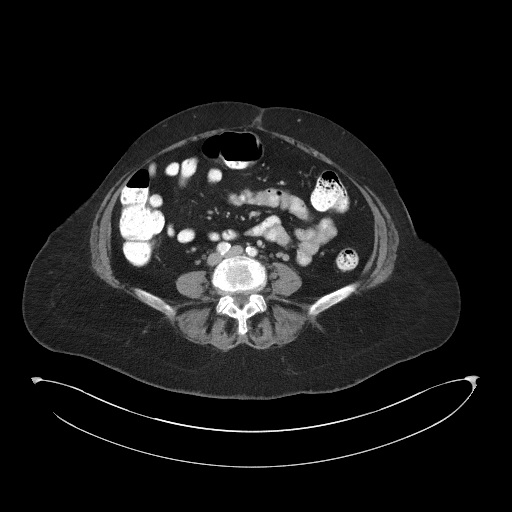
[im 51/89  soft-tissue]
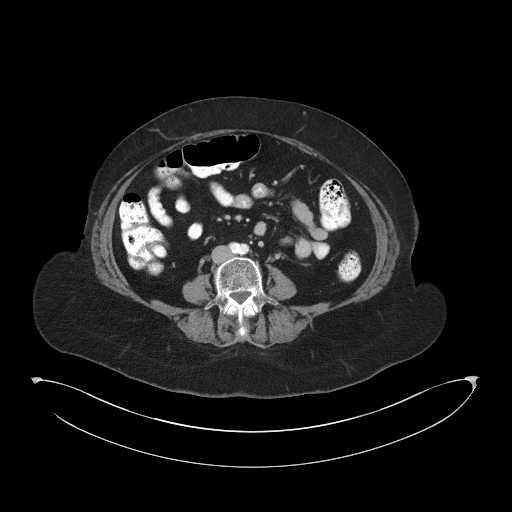
[im 56/89  soft-tissue]
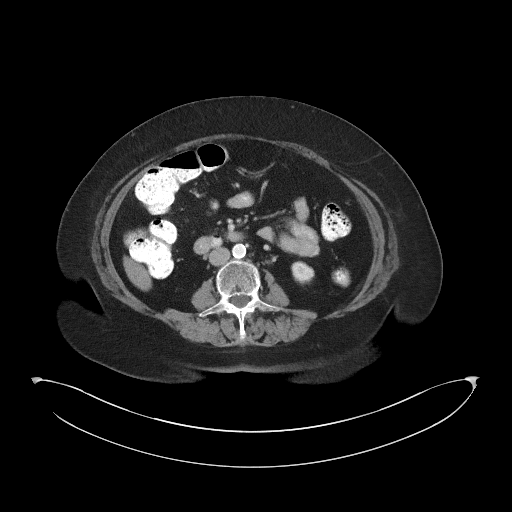
[im 56/89  bone]
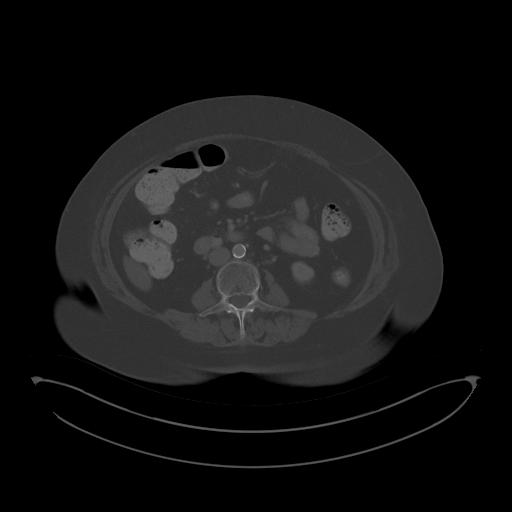
[im 65/89  soft-tissue]
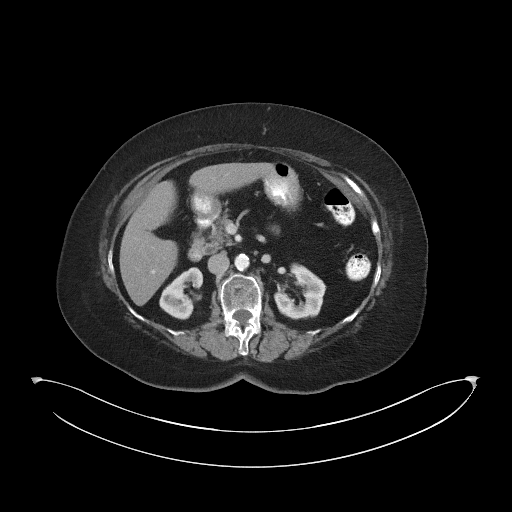
[im 70/89  soft-tissue]
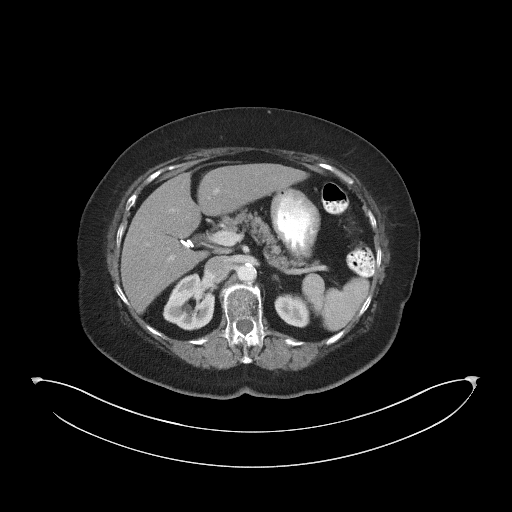
[im 75/89  soft-tissue]
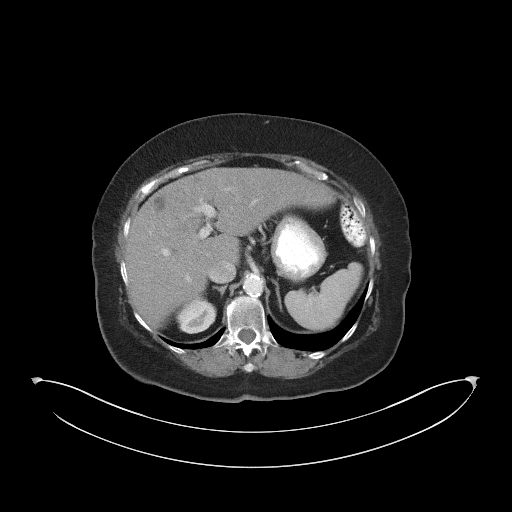
[im 84/89  soft-tissue]
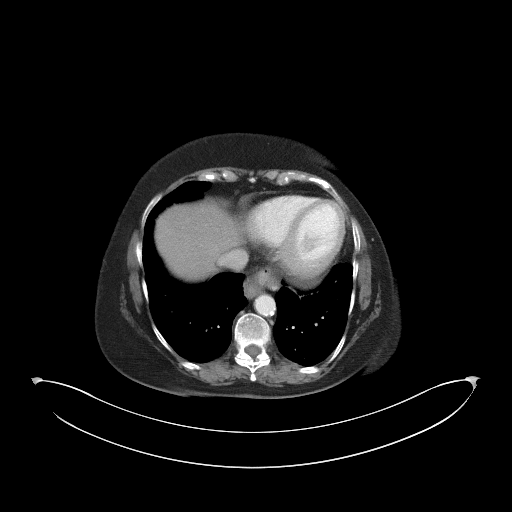

[Series 5: coronal st · coronal · 0.85mm/px · 3 of 102 slices shown]
[im 34/102  soft-tissue]
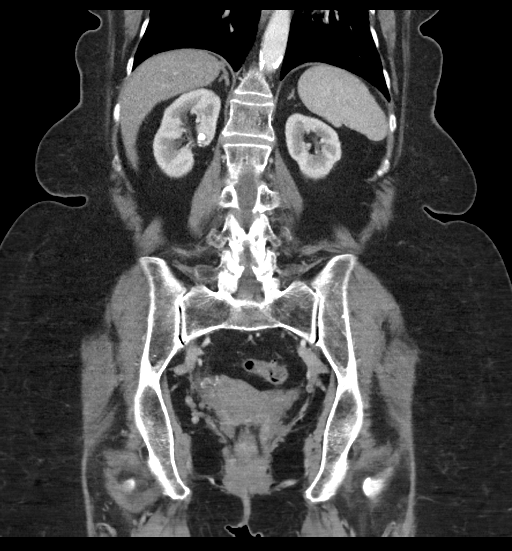
[im 45/102  soft-tissue]
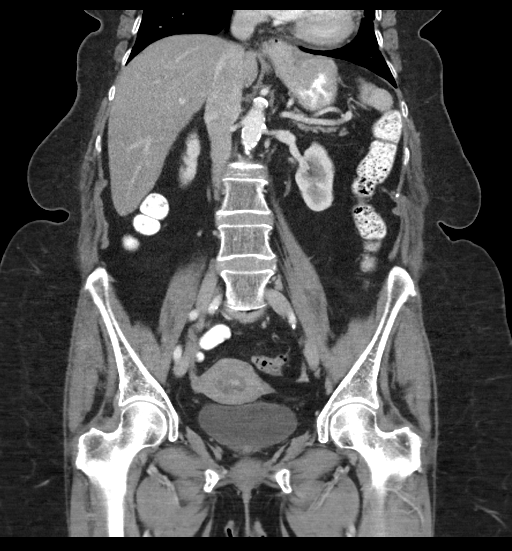
[im 57/102  soft-tissue]
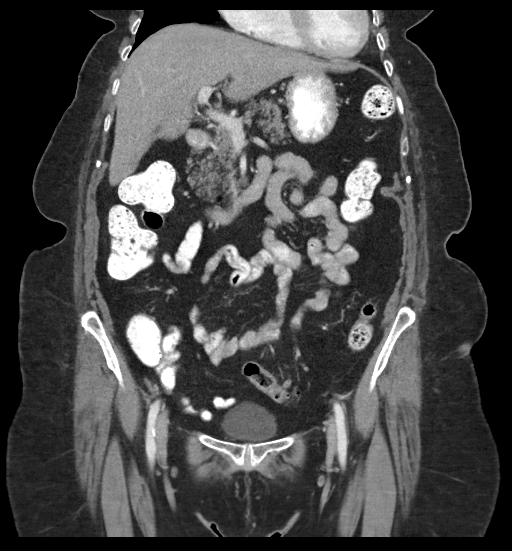

[16 of 46 positions shown; findings below may reference images not displayed]

FINDINGS: Lower Chest: No acute findings.

Hepatobiliary: Mild diffuse hepatic steatosis noted. A few tiny
hepatic cysts are again seen, largest measuring 1.4 cm. No hepatic
masses identified. Prior cholecystectomy. No evidence of biliary
obstruction.

Pancreas:  No mass or inflammatory changes.

Spleen: Within normal limits in size and appearance.

Adrenals/Urinary Tract: Normal adrenal glands. No renal masses
identified. A 1.3 cm renal artery aneurysm is seen in the right
renal hilum which shows thick peripheral calcification. A 2 mm
calculus is seen in the midpole the right kidney. A 13 mm calculus
is seen in the left renal pelvis. No evidence of ureteral calculi or
hydronephrosis. Unremarkable unopacified urinary bladder.

Stomach/Bowel: Small hiatal hernia noted. No evidence of
obstruction, inflammatory process or abnormal fluid collections.
Diverticulosis is seen mainly involving the sigmoid colon, however
there is no evidence of diverticulitis.

Vascular/Lymphatic: No pathologically enlarged lymph nodes. No acute
vascular findings. Aortic atherosclerotic calcification noted.

Reproductive: A rounded area of heterogeneous attenuation is seen in
the central uterus, likely within the endometrial cavity measuring
approximately 2 cm. This is suspicious for endometrial thickening,
and endometrial carcinoma cannot be excluded in a postmenopausal
female. Adnexal regions are unremarkable.

Other:  None.

Musculoskeletal:  No suspicious bone lesions identified.
IMPRESSION: Colonic diverticulosis, without radiographic evidence of
diverticulitis or other acute findings.

Small hiatal hernia.

Mild hepatic steatosis.

1.3 cm right renal artery aneurysm. Recommend continued follow-up by
CT every 1-2 years. This recommendation follows ACR consensus
guidelines: White Paper of the ACR Incidental Findings Committee II
on Vascular Findings. [HOSPITAL] [DATE].

Probable diffuse endometrial thickening; endometrial carcinoma
cannot be excluded in a postmenopausal female. Recommend
transvaginal pelvic ultrasound for further evaluation.

Aortic Atherosclerosis ([id number]-B0O.O).

## 2021-10-13 ENCOUNTER — Ambulatory Visit: Payer: Medicare Other | Admitting: Cardiology

## 2021-12-21 DIAGNOSIS — F4321 Adjustment disorder with depressed mood: Secondary | ICD-10-CM | POA: Diagnosis not present

## 2021-12-21 DIAGNOSIS — Z6824 Body mass index (BMI) 24.0-24.9, adult: Secondary | ICD-10-CM | POA: Diagnosis not present

## 2021-12-21 DIAGNOSIS — Z2821 Immunization not carried out because of patient refusal: Secondary | ICD-10-CM | POA: Diagnosis not present

## 2021-12-21 DIAGNOSIS — I119 Hypertensive heart disease without heart failure: Secondary | ICD-10-CM | POA: Diagnosis not present

## 2022-02-07 DIAGNOSIS — F4321 Adjustment disorder with depressed mood: Secondary | ICD-10-CM | POA: Diagnosis not present

## 2022-02-07 DIAGNOSIS — I272 Pulmonary hypertension, unspecified: Secondary | ICD-10-CM | POA: Diagnosis not present

## 2022-02-07 DIAGNOSIS — Z79899 Other long term (current) drug therapy: Secondary | ICD-10-CM | POA: Diagnosis not present

## 2022-02-07 DIAGNOSIS — Z0000 Encounter for general adult medical examination without abnormal findings: Secondary | ICD-10-CM | POA: Diagnosis not present

## 2022-02-07 DIAGNOSIS — Z780 Asymptomatic menopausal state: Secondary | ICD-10-CM | POA: Diagnosis not present

## 2022-02-07 DIAGNOSIS — Z13820 Encounter for screening for osteoporosis: Secondary | ICD-10-CM | POA: Diagnosis not present

## 2022-02-07 DIAGNOSIS — Z131 Encounter for screening for diabetes mellitus: Secondary | ICD-10-CM | POA: Diagnosis not present

## 2022-02-13 DIAGNOSIS — H43812 Vitreous degeneration, left eye: Secondary | ICD-10-CM | POA: Diagnosis not present

## 2022-02-13 DIAGNOSIS — H25813 Combined forms of age-related cataract, bilateral: Secondary | ICD-10-CM | POA: Diagnosis not present

## 2022-03-07 DIAGNOSIS — Z1231 Encounter for screening mammogram for malignant neoplasm of breast: Secondary | ICD-10-CM | POA: Diagnosis not present

## 2022-03-08 NOTE — Progress Notes (Unsigned)
Cardiology Office Note:    Date:  03/09/2022   ID:  Debra Mullen, DOB Jan 06, 1946, MRN [id number]  PCP:  Buckner Malta, MD  Cardiologist:  Norman Herrlich, MD    Referring MD: Buckner Malta, MD    ASSESSMENT:    1. Primary hypertension   2. Nonrheumatic mitral valve regurgitation   3. Mixed hyperlipidemia    PLAN:    In order of problems listed above:  She sporadically checks blood pressure always less than 120 and 130 systolic feels well we will continue her current regimen of loop diuretic MRA carvedilol.  I did ask her to get in the habit of regularly checking her blood pressure good technique with education record and bring to her next visit Stable clinically insignificant does not need a repeat echocardiogram Currently not on lipid-lowering treatment   Next appointment: 1 year   Medication Adjustments/Labs and Tests Ordered: Current medicines are reviewed at length with the patient today.  Concerns regarding medicines are outlined above.  No orders of the defined types were placed in this encounter.  No orders of the defined types were placed in this encounter.   Chief complaint follow-up hypertension  History of Present Illness:    Debra Mullen is a 76 y.o. female with a hx of hypertension and hyperlipidemia last seen by Dr. Servando Salina 06/29/2021.  Referrals for pulmonary artery hypertension but there is no documentation of this problem in her cardiology records. Compliance with diet, lifestyle and medications: Yes  She is pleased with the quality of her life blood pressure is well controlled tolerates her medications without side effect No edema chest pain shortness of breath palpitation or syncope. She gets good activity and mastered at a walking program sodium restricts. Past Medical History:  Diagnosis Date   Anxiety    Arthritis    Bilateral leg edema 04/20/2021   Cervical spondylosis with myelopathy and radiculopathy 04/14/2014   Chest pain 04/20/2021    Complication of anesthesia    Elevated cholesterol    Epigastric pain    GERD (gastroesophageal reflux disease)    Hypertension    Hypothyroidism    Mixed hyperlipidemia 04/20/2021   Murmur, heart    Neuromuscular disorder (HCC)    family hx of Muscular Dystrophy   Nonrheumatic mitral valve regurgitation 04/20/2021   Nonrheumatic tricuspid valve regurgitation 04/20/2021   Osteoporosis    Overweight    PONV (postoperative nausea and vomiting)    Pulmonary hypertension (HCC) 04/20/2021   Rosacea    Shortness of breath 04/20/2021   Spinal stenosis in cervical region 04/15/2014   Tinnitus of both ears    hears out of right ear better   Vitamin deficiency     Past Surgical History:  Procedure Laterality Date   ANTERIOR CERVICAL DECOMP/DISCECTOMY FUSION N/A 04/14/2014   Procedure: ANTERIOR CERVICAL DECOMPRESSION/DISCECTOMY FUSION 3 LEVELS cervical four/five, five/six, six/seven;  Surgeon: Cristi Loron, MD;  Location: [facility];  Service: Neurosurgery;  Laterality: N/A;   APPENDECTOMY  1960   CHOLECYSTECTOMY  2009   COLONOSCOPY  11/13/2017   Moderate sigmoid diverticulosis. Otherwise normal colonoscopy   NECK SURGERY     OVARIAN CYST SURGERY     "yrs' ago--age 36   TUBAL LIGATION      Current Medications: Current Meds  Medication Sig   carvedilol (COREG) 12.5 MG tablet Take 12.5 mg by mouth 2 (two) times daily with a meal.   furosemide (LASIX) 20 MG tablet Take 20 mg by mouth every other  day.   omeprazole (PRILOSEC) 20 MG capsule Take 20 mg by mouth daily.   spironolactone (ALDACTONE) 25 MG tablet Take 1 tablet by mouth daily.   VITAMIN D PO Take 50,000 Units by mouth once a week.     Allergies:   Losartan potassium   Social History   Socioeconomic History   Marital status: Married    Spouse name: Not on file   Number of children: Not on file   Years of education: Not on file   Highest education level: Not on file  Occupational History   Not on file  Tobacco  Use   Smoking status: Never   Smokeless tobacco: Never  Substance and Sexual Activity   Alcohol use: No   Drug use: No   Sexual activity: Not Currently  Other Topics Concern   Not on file  Social History Narrative   Not on file   Social Determinants of Health   Financial Resource Strain: Not on file  Food Insecurity: Not on file  Transportation Needs: Not on file  Physical Activity: Not on file  Stress: Not on file  Social Connections: Not on file     Family History: The patient's family history includes Cancer in her brother and father; Diabetes in her brother and sister; Heart attack in her brother and mother; Hypertension in her mother and sister; Stroke in her brother. ROS:   Please see the history of present illness.    All other systems reviewed and are negative.  EKGs/Labs/Other Studies Reviewed:    The following studies were reviewed today:  Echocardiogram done on April 11, 2021 LVEF 60 to 65%.  The diastolic filling pattern indicates impaired relaxation.  Left atrium severely dilated by volume.  Right atrium is mildly enlarged.  There is mild aortic valve sclerosis.  Moderate to severe mitral regurgitation is present.  Mild to moderate tricuspid regurgitation is present.  Right ventricular systolic pressure 56 mmHg.  There is small generalized pericardial effusion.   Pharmacologic nuclear stress test July 2022 Nuclear stress EF: 57%. The left ventricular ejection fraction is normal (55-65%). There was no ST segment deviation noted during stress. Defect 1: There is a medium defect of moderate severity present in the basal anteroseptal and mid anteroseptal location. Suggestive of breast attenuation. The study is normal. This is a low risk study.  EKG:  EKG ordered today and personally reviewed.  The ekg ordered today demonstrates SRTH minor ST otherwise normal  Recent Labs: 02/08/2022 cholesterol 267 HDL 67 triglycerides 142 non-HDL cholesterol is quite elevated 200  she is not on lipid-lowering treatment Creatinine 0.8 potassium 4.1 hemoglobin 13.7  Physical Exam:    VS:  BP (!) 110/56   Pulse 67   Ht 5' 5.6" (1.666 m)   Wt 150 lb 3.2 oz (68.1 kg)   SpO2 98%   BMI 24.54 kg/m     Wt Readings from Last 3 Encounters:  03/09/22 150 lb 3.2 oz (68.1 kg)  06/29/21 172 lb 9.6 oz (78.3 kg)  04/25/21 182 lb (82.6 kg)     GEN:  Well nourished, well developed in no acute distress HEENT: Normal NECK: No JVD; No carotid bruits LYMPHATICS: No lymphadenopathy CARDIAC: RRR, no murmurs, rubs, gallops RESPIRATORY:  Clear to auscultation without rales, wheezing or rhonchi  ABDOMEN: Soft, non-tender, non-distended MUSCULOSKELETAL:  No edema; No deformity  SKIN: Warm and dry NEUROLOGIC:  Alert and oriented x 3 PSYCHIATRIC:  Normal affect    Signed, Norman Herrlich, MD  03/09/2022  9:45 AM    [facility]

## 2022-03-09 ENCOUNTER — Ambulatory Visit (INDEPENDENT_AMBULATORY_CARE_PROVIDER_SITE_OTHER): Payer: Medicare Other | Admitting: Cardiology

## 2022-03-09 ENCOUNTER — Encounter: Payer: Self-pay | Admitting: Cardiology

## 2022-03-09 VITALS — BP 110/56 | HR 67 | Ht 65.6 in | Wt 150.2 lb

## 2022-03-09 DIAGNOSIS — I340 Nonrheumatic mitral (valve) insufficiency: Secondary | ICD-10-CM | POA: Diagnosis not present

## 2022-03-09 DIAGNOSIS — I10 Essential (primary) hypertension: Secondary | ICD-10-CM | POA: Diagnosis not present

## 2022-03-09 DIAGNOSIS — E782 Mixed hyperlipidemia: Secondary | ICD-10-CM

## 2022-03-09 MED ORDER — CARVEDILOL 12.5 MG PO TABS: 12.5000 mg | ORAL_TABLET | Freq: Two times a day (BID) | ORAL | 3 refills | Status: DC

## 2022-03-09 NOTE — Patient Instructions (Signed)
Medication Instructions:  Your physician recommends that you continue on your current medications as directed. Please refer to the Current Medication list given to you today.  *If you need a refill on your cardiac medications before your next appointment, please call your pharmacy*   Lab Work: None If you have labs (blood work) drawn today and your tests are completely normal, you will receive your results only by: MyChart Message (if you have MyChart) OR A paper copy in the mail If you have any lab test that is abnormal or we need to change your treatment, we will call you to review the results.   Testing/Procedures: None   Follow-Up: At [facility], you and your health needs are our priority.  As part of our continuing mission to provide you with exceptional heart care, we have created designated Provider Care Teams.  These Care Teams include your primary Cardiologist (physician) and Advanced Practice Providers (APPs -  Physician Assistants and Nurse Practitioners) who all work together to provide you with the care you need, when you need it.  We recommend signing up for the patient portal called "MyChart".  Sign up information is provided on this After Visit Summary.  MyChart is used to connect with patients for Virtual Visits (Telemedicine).  Patients are able to view lab/test results, encounter notes, upcoming appointments, etc.  Non-urgent messages can be sent to your provider as well.   To learn more about what you can do with MyChart, go to [email or web address].    Your next appointment:   1 year(s)  The format for your next appointment:   In Person  Provider:   Brian Munley, MD    Other Instructions None  Important Information About [employer]         [facility]  Tips to measure your blood pressure correctly  To determine whether you have hypertension, a medical professional will take a blood pressure reading. How you prepare for the test, the position of  your arm, and other factors can change a blood pressure reading by 10% or more. That could be enough to hide high blood pressure, start you on a drug you don't really need, or lead your doctor to incorrectly adjust your medications. National and international guidelines offer specific instructions for measuring blood pressure. If a doctor, nurse, or medical assistant isn't doing it right, don't hesitate to ask him or her to get with the guidelines. Here's what you can do to ensure a correct reading:  Don't drink a caffeinated beverage or smoke during the 30 minutes before the test.  Sit quietly for five minutes before the test begins.  During the measurement, sit in a chair with your feet on the floor and your arm supported so your elbow is at about heart level.  The inflatable part of the cuff should completely cover at least 80% of your upper arm, and the cuff should be placed on bare skin, not over a shirt.  Don't talk during the measurement.  Have your blood pressure measured twice, with a brief break in between. If the readings are different by 5 points or more, have it done a third time. There are times to break these rules. If you sometimes feel lightheaded when getting out of bed in the morning or when you stand after sitting, you should have your blood pressure checked while seated and then while standing to see if it falls from one position to the next. Because blood pressure varies throughout the day, your doctor will   rarely diagnose hypertension on the basis of a single reading. Instead, he or she will want to confirm the measurements on at least two occasions, usually within a few weeks of one another. The exception to this rule is if you have a blood pressure reading of 180/110 mm Hg or higher. A result this high usually calls for prompt treatment. It's also a good idea to have your blood pressure measured in both arms at least once, since the reading in one arm (usually the right) may be  higher than that in the left. A 2014 study in The American Journal of Medicine of nearly 3,400 people found average arm- to-arm differences in systolic blood pressure of about 5 points. The higher number should be used to make treatment decisions. In 2017, new guidelines from the American Heart Association, the American College of Cardiology, and nine other health organizations lowered the diagnosis of high blood pressure to 130/80 mm Hg or higher for all adults. The guidelines also redefined the various blood pressure categories to now include normal, elevated, Stage 1 hypertension, Stage 2 hypertension, and hypertensive crisis (see "Blood pressure categories"). Blood pressure categories  Blood pressure category SYSTOLIC (upper number)  DIASTOLIC (lower number)  Normal Less than 120 mm Hg and Less than 80 mm Hg  Elevated 120-129 mm Hg and Less than 80 mm Hg  High blood pressure: Stage 1 hypertension 130-139 mm Hg or 80-89 mm Hg  High blood pressure: Stage 2 hypertension 140 mm Hg or higher or 90 mm Hg or higher  Hypertensive crisis (consult your doctor immediately) Higher than 180 mm Hg and/or Higher than 120 mm Hg  Source: American Heart Association and American Stroke Association. For more on getting your blood pressure under control, buy Controlling Your Blood Pressure, a Special Health Report from [employer].   

## 2022-03-15 DIAGNOSIS — H25813 Combined forms of age-related cataract, bilateral: Secondary | ICD-10-CM | POA: Diagnosis not present

## 2022-03-15 DIAGNOSIS — H43812 Vitreous degeneration, left eye: Secondary | ICD-10-CM | POA: Diagnosis not present

## 2022-06-15 DIAGNOSIS — Z01818 Encounter for other preprocedural examination: Secondary | ICD-10-CM | POA: Diagnosis not present

## 2022-06-15 DIAGNOSIS — H25813 Combined forms of age-related cataract, bilateral: Secondary | ICD-10-CM | POA: Diagnosis not present

## 2022-06-15 DIAGNOSIS — H25812 Combined forms of age-related cataract, left eye: Secondary | ICD-10-CM | POA: Diagnosis not present

## 2022-06-15 DIAGNOSIS — H40033 Anatomical narrow angle, bilateral: Secondary | ICD-10-CM | POA: Diagnosis not present

## 2022-07-03 DIAGNOSIS — H25812 Combined forms of age-related cataract, left eye: Secondary | ICD-10-CM | POA: Diagnosis not present

## 2022-07-03 DIAGNOSIS — H259 Unspecified age-related cataract: Secondary | ICD-10-CM | POA: Diagnosis not present

## 2022-07-03 DIAGNOSIS — H52223 Regular astigmatism, bilateral: Secondary | ICD-10-CM | POA: Diagnosis not present

## 2022-07-03 DIAGNOSIS — I10 Essential (primary) hypertension: Secondary | ICD-10-CM | POA: Diagnosis not present

## 2022-07-03 DIAGNOSIS — H40033 Anatomical narrow angle, bilateral: Secondary | ICD-10-CM | POA: Diagnosis not present

## 2022-07-27 DIAGNOSIS — Z01818 Encounter for other preprocedural examination: Secondary | ICD-10-CM | POA: Diagnosis not present

## 2022-07-27 DIAGNOSIS — H25811 Combined forms of age-related cataract, right eye: Secondary | ICD-10-CM | POA: Diagnosis not present

## 2022-08-09 DIAGNOSIS — M791 Myalgia, unspecified site: Secondary | ICD-10-CM | POA: Diagnosis not present

## 2022-08-09 DIAGNOSIS — F4321 Adjustment disorder with depressed mood: Secondary | ICD-10-CM | POA: Diagnosis not present

## 2022-08-09 DIAGNOSIS — Z23 Encounter for immunization: Secondary | ICD-10-CM | POA: Diagnosis not present

## 2022-08-09 DIAGNOSIS — I119 Hypertensive heart disease without heart failure: Secondary | ICD-10-CM | POA: Diagnosis not present

## 2022-08-09 DIAGNOSIS — I272 Pulmonary hypertension, unspecified: Secondary | ICD-10-CM | POA: Diagnosis not present

## 2022-10-20 ENCOUNTER — Other Ambulatory Visit: Payer: Self-pay | Admitting: Cardiology

## 2022-11-08 DIAGNOSIS — E785 Hyperlipidemia, unspecified: Secondary | ICD-10-CM | POA: Diagnosis not present

## 2022-11-08 DIAGNOSIS — I119 Hypertensive heart disease without heart failure: Secondary | ICD-10-CM | POA: Diagnosis not present

## 2022-11-08 DIAGNOSIS — H8113 Benign paroxysmal vertigo, bilateral: Secondary | ICD-10-CM | POA: Diagnosis not present

## 2022-11-08 DIAGNOSIS — I272 Pulmonary hypertension, unspecified: Secondary | ICD-10-CM | POA: Diagnosis not present

## 2022-11-08 DIAGNOSIS — Z79899 Other long term (current) drug therapy: Secondary | ICD-10-CM | POA: Diagnosis not present

## 2022-12-10 DIAGNOSIS — R197 Diarrhea, unspecified: Secondary | ICD-10-CM | POA: Diagnosis not present

## 2022-12-10 DIAGNOSIS — I119 Hypertensive heart disease without heart failure: Secondary | ICD-10-CM | POA: Diagnosis not present

## 2022-12-10 DIAGNOSIS — I272 Pulmonary hypertension, unspecified: Secondary | ICD-10-CM | POA: Diagnosis not present

## 2022-12-10 DIAGNOSIS — E785 Hyperlipidemia, unspecified: Secondary | ICD-10-CM | POA: Diagnosis not present

## 2022-12-14 DIAGNOSIS — R197 Diarrhea, unspecified: Secondary | ICD-10-CM | POA: Diagnosis not present

## 2023-01-28 ENCOUNTER — Other Ambulatory Visit: Payer: Self-pay | Admitting: Cardiology

## 2023-01-28 NOTE — Telephone Encounter (Signed)
Rx to pharmacy

## 2023-02-11 DIAGNOSIS — Z0000 Encounter for general adult medical examination without abnormal findings: Secondary | ICD-10-CM | POA: Diagnosis not present

## 2023-02-11 DIAGNOSIS — Z79899 Other long term (current) drug therapy: Secondary | ICD-10-CM | POA: Diagnosis not present

## 2023-02-11 DIAGNOSIS — I119 Hypertensive heart disease without heart failure: Secondary | ICD-10-CM | POA: Diagnosis not present

## 2023-02-11 DIAGNOSIS — Z1331 Encounter for screening for depression: Secondary | ICD-10-CM | POA: Diagnosis not present

## 2023-02-11 DIAGNOSIS — I272 Pulmonary hypertension, unspecified: Secondary | ICD-10-CM | POA: Diagnosis not present

## 2023-02-11 DIAGNOSIS — Z1339 Encounter for screening examination for other mental health and behavioral disorders: Secondary | ICD-10-CM | POA: Diagnosis not present

## 2023-04-29 ENCOUNTER — Other Ambulatory Visit: Payer: Self-pay | Admitting: Cardiology

## 2023-05-26 ENCOUNTER — Other Ambulatory Visit: Payer: Self-pay | Admitting: Cardiology

## 2023-05-30 ENCOUNTER — Other Ambulatory Visit: Payer: Self-pay | Admitting: Cardiology

## 2023-06-28 ENCOUNTER — Other Ambulatory Visit: Payer: Self-pay | Admitting: Cardiology

## 2023-07-18 ENCOUNTER — Ambulatory Visit: Payer: Medicare Other | Admitting: Cardiology

## 2023-08-13 DIAGNOSIS — I272 Pulmonary hypertension, unspecified: Secondary | ICD-10-CM | POA: Diagnosis not present

## 2023-08-13 DIAGNOSIS — M791 Myalgia, unspecified site: Secondary | ICD-10-CM | POA: Diagnosis not present

## 2023-08-13 DIAGNOSIS — Q459 Congenital malformation of digestive system, unspecified: Secondary | ICD-10-CM | POA: Diagnosis not present

## 2023-08-13 DIAGNOSIS — T466X5A Adverse effect of antihyperlipidemic and antiarteriosclerotic drugs, initial encounter: Secondary | ICD-10-CM | POA: Diagnosis not present

## 2023-08-13 DIAGNOSIS — Z23 Encounter for immunization: Secondary | ICD-10-CM | POA: Diagnosis not present

## 2023-08-14 ENCOUNTER — Other Ambulatory Visit (HOSPITAL_BASED_OUTPATIENT_CLINIC_OR_DEPARTMENT_OTHER): Payer: Self-pay | Admitting: Family Medicine

## 2023-08-14 DIAGNOSIS — I722 Aneurysm of renal artery: Secondary | ICD-10-CM

## 2023-08-21 ENCOUNTER — Ambulatory Visit: Payer: Medicare Other | Admitting: Cardiology

## 2023-08-22 ENCOUNTER — Telehealth (HOSPITAL_BASED_OUTPATIENT_CLINIC_OR_DEPARTMENT_OTHER): Payer: Self-pay

## 2023-08-23 ENCOUNTER — Other Ambulatory Visit (HOSPITAL_BASED_OUTPATIENT_CLINIC_OR_DEPARTMENT_OTHER): Payer: Self-pay | Admitting: Family Medicine

## 2023-08-23 DIAGNOSIS — I722 Aneurysm of renal artery: Secondary | ICD-10-CM

## 2024-03-10 DIAGNOSIS — I272 Pulmonary hypertension, unspecified: Secondary | ICD-10-CM | POA: Diagnosis not present

## 2024-03-10 DIAGNOSIS — I119 Hypertensive heart disease without heart failure: Secondary | ICD-10-CM | POA: Diagnosis not present

## 2024-03-10 DIAGNOSIS — M791 Myalgia, unspecified site: Secondary | ICD-10-CM | POA: Diagnosis not present

## 2024-03-10 DIAGNOSIS — E785 Hyperlipidemia, unspecified: Secondary | ICD-10-CM | POA: Diagnosis not present

## 2024-03-10 DIAGNOSIS — R7302 Impaired glucose tolerance (oral): Secondary | ICD-10-CM | POA: Diagnosis not present

## 2024-03-10 DIAGNOSIS — Z0000 Encounter for general adult medical examination without abnormal findings: Secondary | ICD-10-CM | POA: Diagnosis not present

## 2024-03-10 DIAGNOSIS — Z79899 Other long term (current) drug therapy: Secondary | ICD-10-CM | POA: Diagnosis not present

## 2024-09-09 DIAGNOSIS — R1032 Left lower quadrant pain: Secondary | ICD-10-CM | POA: Diagnosis not present

## 2024-09-09 DIAGNOSIS — Z79899 Other long term (current) drug therapy: Secondary | ICD-10-CM | POA: Diagnosis not present

## 2024-09-09 DIAGNOSIS — R2681 Unsteadiness on feet: Secondary | ICD-10-CM | POA: Diagnosis not present
